# Patient Record
Sex: Female | Born: 1970 | Race: White | Hispanic: No | Marital: Married | Smoking: Never smoker
Health system: Southern US, Community
[De-identification: ages and names within clinical notes are randomized; demographics above are authoritative.]

## PROBLEM LIST (undated history)

## (undated) DIAGNOSIS — T8859XA Other complications of anesthesia, initial encounter: Secondary | ICD-10-CM

## (undated) DIAGNOSIS — G43909 Migraine, unspecified, not intractable, without status migrainosus: Secondary | ICD-10-CM

## (undated) DIAGNOSIS — F909 Attention-deficit hyperactivity disorder, unspecified type: Secondary | ICD-10-CM

## (undated) HISTORY — DX: Attention-deficit hyperactivity disorder, unspecified type: F90.9

## (undated) HISTORY — PX: APPENDECTOMY: SHX54

## (undated) HISTORY — DX: Migraine, unspecified, not intractable, without status migrainosus: G43.909

## (undated) HISTORY — PX: LIPOMA RESECTION: SHX23

---

## 2010-04-14 ENCOUNTER — Ambulatory Visit: Payer: Self-pay | Admitting: Family Medicine

## 2010-05-01 LAB — HM PAP SMEAR: HM Pap smear: NORMAL

## 2010-05-13 ENCOUNTER — Emergency Department: Payer: Self-pay | Admitting: Internal Medicine

## 2010-10-25 DIAGNOSIS — F909 Attention-deficit hyperactivity disorder, unspecified type: Secondary | ICD-10-CM

## 2010-10-25 HISTORY — DX: Attention-deficit hyperactivity disorder, unspecified type: F90.9

## 2010-10-25 HISTORY — PX: ABDOMINAL HYSTERECTOMY: SHX81

## 2011-05-04 IMAGING — US ULTRASOUND RIGHT BREAST
1 series · 9 of 9 positions shown · non-contrast
Comparison: None.

REASON FOR EXAM: RT BR PAIN UIQ
COMMENTS:

PROCEDURE:     US  - US BREAST RIGHT  - April 14, 2010  [DATE]
RESULT:

[Series 1: ultrasound right breast · 9 of 9 slices shown]
[im 1/9]
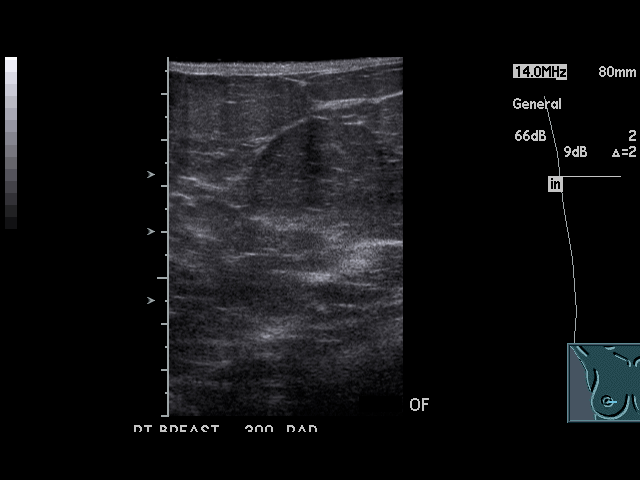
[im 2/9]
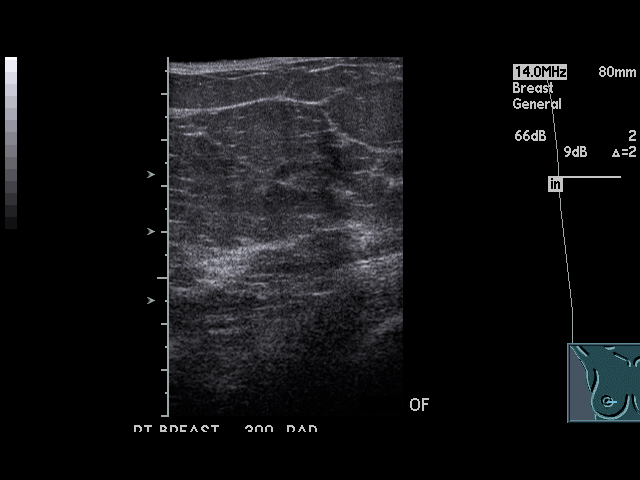
[im 3/9]
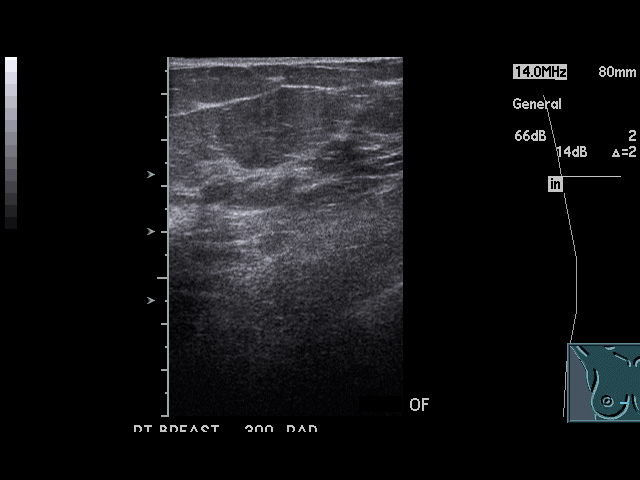
[im 4/9]
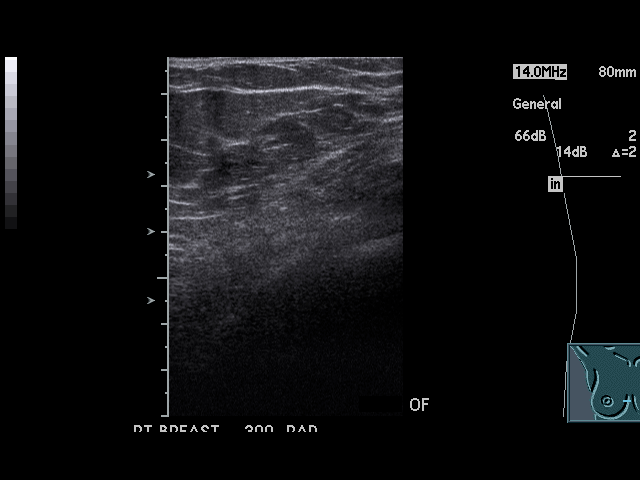
[im 5/9]
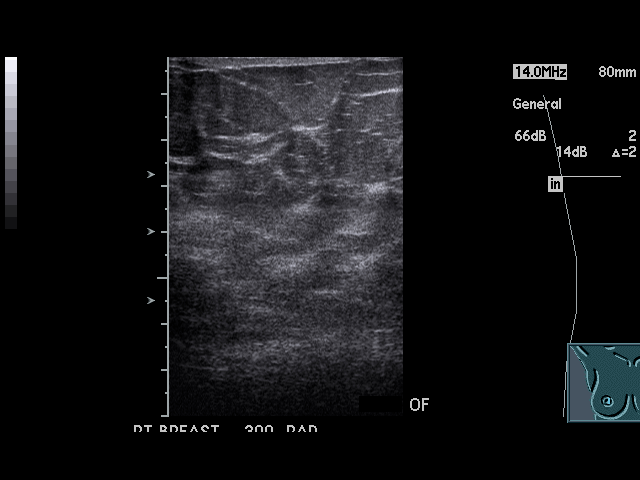
[im 6/9]
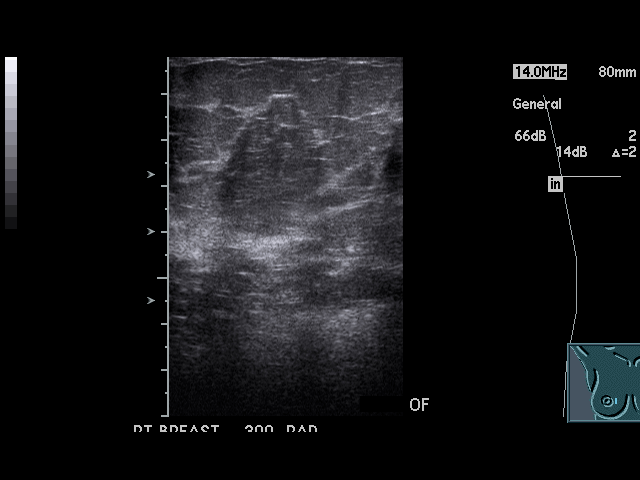
[im 7/9]
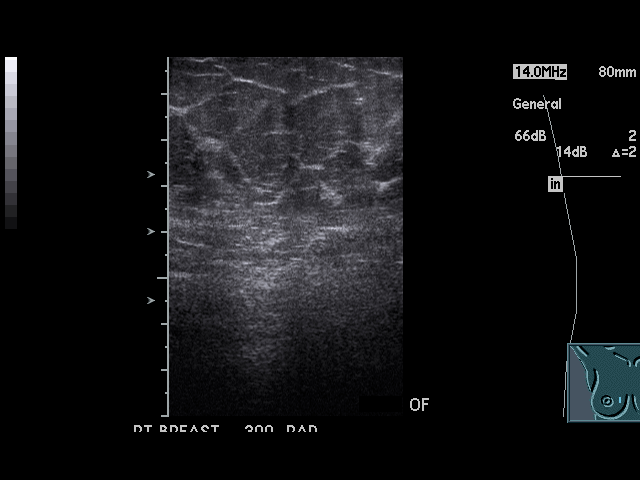
[im 8/9]
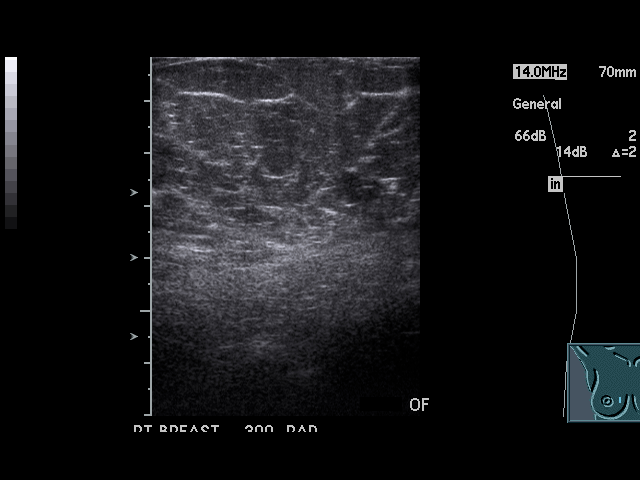
[im 9/9]
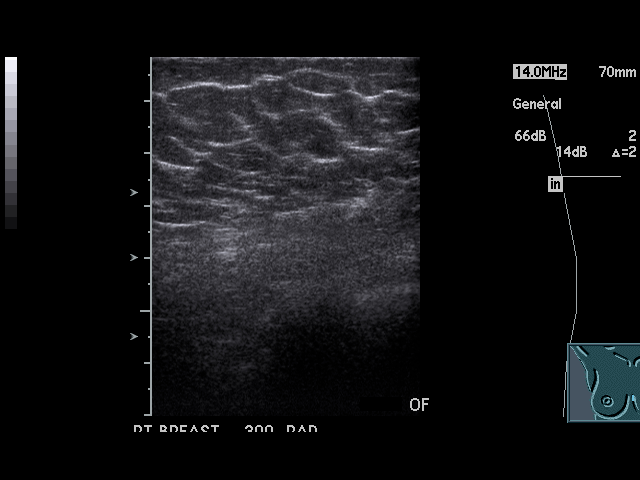

[9 of 9 positions shown; findings below may reference images not displayed]

FINDINGS: Bilateral breasts demonstrate a heterogeneous breast density.
There is no new dominant mass, architectural distortion or clusters of
suspicious appearing microcalcifications.

Further evaluation was performed with real time sonography of the right
breast at the 3 o'clock position.  There is no solid or cystic mass.  There
is no architectural distortion.
IMPRESSION: 1.Negative bilateral mammogram.

2.Negative focused right breast ultrasound at the 3 o'clock position.

BI-RADS: Category 2 - Benign Findings

## 2011-09-16 ENCOUNTER — Encounter: Payer: Self-pay | Admitting: Emergency Medicine

## 2011-09-16 ENCOUNTER — Emergency Department (HOSPITAL_COMMUNITY)
Admission: EM | Admit: 2011-09-16 | Discharge: 2011-09-16 | Disposition: A | Discharge status: Home / Self Care | Payer: BC Managed Care – PPO | Attending: Emergency Medicine | Admitting: Emergency Medicine

## 2011-09-16 DIAGNOSIS — K625 Hemorrhage of anus and rectum: Secondary | ICD-10-CM | POA: Insufficient documentation

## 2011-09-16 DIAGNOSIS — K6289 Other specified diseases of anus and rectum: Secondary | ICD-10-CM | POA: Insufficient documentation

## 2011-09-16 DIAGNOSIS — K648 Other hemorrhoids: Secondary | ICD-10-CM | POA: Insufficient documentation

## 2011-09-16 DIAGNOSIS — K921 Melena: Secondary | ICD-10-CM | POA: Insufficient documentation

## 2011-09-16 LAB — CBC
HCT: 42.8 % (ref 36.0–46.0)
MCH: 30.5 pg (ref 26.0–34.0)
MCHC: 33.9 g/dL (ref 30.0–36.0)
RDW: 12.8 % (ref 11.5–15.5)

## 2011-09-16 LAB — COMPREHENSIVE METABOLIC PANEL
AST: 23 U/L (ref 0–37)
Albumin: 4.2 g/dL (ref 3.5–5.2)
BUN: 14 mg/dL (ref 6–23)
Calcium: 10.1 mg/dL (ref 8.4–10.5)
Chloride: 100 mEq/L (ref 96–112)
Creatinine, Ser: 0.71 mg/dL (ref 0.50–1.10)
Total Protein: 7.9 g/dL (ref 6.0–8.3)

## 2011-09-16 LAB — DIFFERENTIAL
Basophils Absolute: 0 10*3/uL (ref 0.0–0.1)
Basophils Relative: 0 % (ref 0–1)
Eosinophils Absolute: 0.1 10*3/uL (ref 0.0–0.7)
Monocytes Absolute: 0.9 10*3/uL (ref 0.1–1.0)
Neutro Abs: 8.4 10*3/uL — ABNORMAL HIGH (ref 1.7–7.7)

## 2011-09-16 MED ORDER — HYDROCODONE-ACETAMINOPHEN 5-325 MG PO TABS
1.0000 | ORAL_TABLET | Freq: Once | ORAL | Status: AC
  Administered 2011-09-16: 1 via ORAL
  Filled 2011-09-16: qty 1

## 2011-09-16 MED ORDER — DOCUSATE SODIUM 100 MG PO CAPS: 100.0000 mg | ORAL_CAPSULE | Freq: Two times a day (BID) | ORAL | Status: AC

## 2011-09-16 MED ORDER — HYDROCODONE-ACETAMINOPHEN 5-500 MG PO TABS: 1.0000 | ORAL_TABLET | Freq: Four times a day (QID) | ORAL | Status: AC | PRN

## 2011-09-16 NOTE — ED Notes (Signed)
PT. REPORTS RECTAL BLEEDING X 10 DAYS , PAIN WHEN SITTING , RECENT TRAVEL TO [place] . "FEELS BLOATED".

## 2011-09-16 NOTE — ED Provider Notes (Signed)
History     CSN: [id number] Arrival date & time: 09/16/2011  7:16 PM   First MD Initiated Contact with Patient 09/16/11 2002      Chief Complaint  Patient presents with  . Rectal Bleeding    (Consider location/radiation/quality/duration/timing/severity/associated sxs/prior treatment) HPI Comments: PAteint has had rectal pressure a for the past 2 weeks was given RX fro Anusol suppositories and cream without relief  Has been using peri colace to soften the stools   Patient is a 40 y.o. female presenting with hematochezia. The history is provided by the patient.  Rectal Bleeding  The current episode started more than 2 weeks ago. The problem occurs continuously. The problem has been unchanged. The pain is severe. The stool is described as streaked with blood. Prior unsuccessful therapies include stool softeners. Associated symptoms include rectal pain. Pertinent negatives include no diarrhea. Urine output has been normal. There were no sick contacts.    History reviewed. No pertinent past medical history.  Past Surgical History  Procedure Date  . Abdominal hysterectomy   . Cesarean section     No family history on file.  History  Substance Use Topics  . Smoking status: Never Smoker   . Smokeless tobacco: Not on file  . Alcohol Use: Yes     OCCASIONAL    OB History    Grav Para Term Preterm Abortions TAB SAB Ect Mult Living                  Review of Systems  Constitutional: Negative.   HENT: Negative.   Eyes: Negative.   Respiratory: Negative.   Cardiovascular: Negative.   Gastrointestinal: Positive for blood in stool, hematochezia and rectal pain. Negative for diarrhea and constipation.  Genitourinary: Negative.   Musculoskeletal: Negative.   Skin: Negative.   Neurological: Negative.   Hematological: Negative.   Psychiatric/Behavioral: Negative.     Allergies  Review of patient's allergies indicates no known allergies.  Home Medications   Current  Outpatient Rx  Name Route Sig Dispense Refill  . AMPHETAMINE-DEXTROAMPHETAMINE 15 MG PO TABS Oral Take 15 mg by mouth daily.      Marland Kitchen HYDROCORTISONE 2.5 % RE CREA Rectal Place 1 application rectally 3 (three) times daily.      Marland Kitchen HYDROCORTISONE ACETATE 25 MG RE SUPP Rectal Place 25 mg rectally 2 (two) times daily.      Marland Kitchen DOCUSATE SODIUM 100 MG PO CAPS Oral Take 1 capsule (100 mg total) by mouth every 12 (twelve) hours. 60 capsule 0  . HYDROCODONE-ACETAMINOPHEN 5-500 MG PO TABS Oral Take 1-2 tablets by mouth every 6 (six) hours as needed for pain. 15 tablet 0    BP 138/82  Pulse 85  Temp(Src) 98.1 F (36.7 C) (Oral)  Resp 19  SpO2 98%  Physical Exam  Nursing note and vitals reviewed. Constitutional: She appears well-developed and well-nourished.  HENT:  Head: Normocephalic.  Eyes: EOM are normal.  Neck: Neck supple.  Cardiovascular: Regular rhythm.   Pulmonary/Chest: Breath sounds normal.  Abdominal: Bowel sounds are normal.  Genitourinary: Rectal exam shows internal hemorrhoid. Rectal exam shows no external hemorrhoid and no fissure.  Musculoskeletal: Normal range of motion.  Neurological: She is alert.  Skin: Skin is warm and dry.  Psychiatric: She has a normal mood and affect.    ED Course  Procedures (including critical care time)  Labs Reviewed  CBC - Abnormal; Notable for the following:    WBC 12.3 (*)    All other components within  normal limits  DIFFERENTIAL - Abnormal; Notable for the following:    Neutro Abs 8.4 (*)    All other components within normal limits  COMPREHENSIVE METABOLIC PANEL - Abnormal; Notable for the following:    Glucose, Bld 116 (*)    Total Bilirubin 0.2 (*)    All other components within normal limits   No results found.   1. Hemorrhoids, internal     9:07 PM Switched to colace BID Vicodin for pain and Surgical referrla  MDM  hemorrhoids vs perirectal abscess         Arman Filter, NP 09/16/11 2103  Arman Filter,  NP 09/16/11 2108

## 2011-09-17 NOTE — ED Provider Notes (Signed)
Medical screening examination/treatment/procedure(s) were performed by non-physician practitioner and as supervising physician I was immediately available for consultation/collaboration.   Geoffery Lyons, MD 09/17/11 [phone]

## 2011-10-06 ENCOUNTER — Ambulatory Visit (INDEPENDENT_AMBULATORY_CARE_PROVIDER_SITE_OTHER): Payer: BC Managed Care – PPO | Admitting: Surgery

## 2012-01-04 ENCOUNTER — Ambulatory Visit: Payer: BC Managed Care – PPO | Admitting: Internal Medicine

## 2012-01-14 ENCOUNTER — Ambulatory Visit (INDEPENDENT_AMBULATORY_CARE_PROVIDER_SITE_OTHER): Payer: BC Managed Care – PPO | Admitting: Internal Medicine

## 2012-01-14 ENCOUNTER — Encounter: Payer: Self-pay | Admitting: Internal Medicine

## 2012-01-14 VITALS — BP 102/65 | HR 90 | Temp 98.1°F | Resp 16 | Ht 65.0 in | Wt 220.0 lb

## 2012-01-14 DIAGNOSIS — K602 Anal fissure, unspecified: Secondary | ICD-10-CM | POA: Insufficient documentation

## 2012-01-14 DIAGNOSIS — Z0000 Encounter for general adult medical examination without abnormal findings: Secondary | ICD-10-CM

## 2012-01-14 DIAGNOSIS — F3289 Other specified depressive episodes: Secondary | ICD-10-CM

## 2012-01-14 DIAGNOSIS — F32A Depression, unspecified: Secondary | ICD-10-CM

## 2012-01-14 DIAGNOSIS — F339 Major depressive disorder, recurrent, unspecified: Secondary | ICD-10-CM | POA: Insufficient documentation

## 2012-01-14 DIAGNOSIS — F909 Attention-deficit hyperactivity disorder, unspecified type: Secondary | ICD-10-CM | POA: Insufficient documentation

## 2012-01-14 DIAGNOSIS — E669 Obesity, unspecified: Secondary | ICD-10-CM

## 2012-01-14 DIAGNOSIS — G43909 Migraine, unspecified, not intractable, without status migrainosus: Secondary | ICD-10-CM | POA: Insufficient documentation

## 2012-01-14 DIAGNOSIS — F329 Major depressive disorder, single episode, unspecified: Secondary | ICD-10-CM

## 2012-01-14 MED ORDER — LIDOCAINE 5 % EX OINT: TOPICAL_OINTMENT | CUTANEOUS | Status: DC | PRN

## 2012-01-14 NOTE — Patient Instructions (Signed)
I am prescribing lidocaine gel for use as needed on your fissure.  I do recommend daily use of miralax or other bulk forming laxative to help keep your stools from irritating your fissure.

## 2012-01-14 NOTE — Assessment & Plan Note (Addendum)
Mild, managed with wellbutrin 100 mg tid prescribed, but currently taking only 100 mg daily.  Can incresae to  Bid with 2nd dose by 1 pm to avoid insomnia

## 2012-01-14 NOTE — Progress Notes (Signed)
Patient ID: Candice Vaughn, female   DOB: [birth year], 41 y.o.   MRN: [id number]   Patient Active Problem List  Diagnoses  . Migraine syndrome  . 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 Depression  . Obesity (BMI 30-39.9)  . Annual physical exam  . Anal fissure    Subjective:  CC:   Chief Complaint  Patient presents with  . New Patient    HPI:   Candice Vaughn a 41 y.o. female with no significant past medical history who is here to establish primary care in transfer from Dr. Carlynn Purl of [facility].  Her chief complaint today is mild persistent rectal pain secondary to known anal fissure.  Her first occurrence of pain was last year after running a 5 K .  She had an evaluation by a gastroenteroloigst in [place] who offered surgery vs conventional measures.  Since she was not having bleeding she chose conventional measures.  the same time she had been started on Adderall for persistent symptoms of ADD, which has worked well for her and caused no concurrent problems.    Past Medical History  Diagnosis Date  . Migraine syndrome     once a month  . Attention deficit disorder of adult with hyperactivity 2012    diagnosed and treated  Oct 2012    Past Surgical History  Procedure Date  . Cesarean section   . Abdominal hysterectomy jan 2012    endometriosis, inflammation, failed ablation  . Lipoma resection     left axilla, [facility]         The following portions of the patient's history were reviewed and updated as appropriate: Allergies, current medications, and problem list.    Review of Systems:   12 Pt  review of systems was negative except those addressed in the HPI,     History   Social History  . Marital Status: Married    Spouse Name: N/A    Number of Children: N/A  . Years of Education: N/A   Occupational History  . Not on file.   Social History Main Topics  . Smoking status: Never Smoker   . Smokeless tobacco: Never Used    . Alcohol Use: Yes     OCCASIONAL  . Drug Use: No  . Sexually Active: Not on file   Other Topics Concern  . Not on file   Social History Narrative  . No narrative on file    Objective:  BP 102/65  Pulse 90  Temp(Src) 98.1 F (36.7 C) (Oral)  Resp 16  Ht 5\' 5"  (1.651 m)  Wt 220 lb (99.791 kg)  BMI 36.61 kg/m2  SpO2 100%  General appearance: alert, cooperative and appears stated age Ears: normal TM's and external ear canals both ears Throat: lips, mucosa, and tongue normal; teeth and gums normal Neck: no adenopathy, no carotid bruit, supple, symmetrical, trachea midline and thyroid not enlarged, symmetric, no tenderness/mass/nodules Back: symmetric, no curvature. ROM normal. No CVA tenderness. Lungs: clear to auscultation bilaterally Heart: regular rate and rhythm, S1, S2 normal, no murmur, click, rub or gallop Abdomen: soft, non-tender; bowel sounds normal; no masses,  no organomegaly Pulses: 2+ and symmetric Skin: Skin color, texture, turgor normal. No rashes or lesions Lymph nodes: Cervical, supraclavicular, and axillary nodes normal.  Assessment and Plan:  Depression Mild, managed with wellbutrin 100 mg tid prescribed, but currently taking only 100 mg daily.  Can incresae to  Bid with 2nd dose by 1 pm to avoid insomnia  Obesity (BMI 30-39.9) Addressed today. Patient has been working diligently to lower her weight. Her highest was 240 and she is currently 215 based on her weight at home on her scale. She has been working with a Systems analyst twice a week and using a treadmill for daily exercise. We discussed the low glycemic index diet and a handout has been given.  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 Lidocaine gel has been prescribed for topical use per patient request. I also recommend soaking in Epsom salts.  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  Annual physical exam She has no history of hypertension or hyperlipidemia or diabetes and is up-to-date on her GYN exams through [facility].     Updated Medication List Outpatient Encounter Prescriptions as of 01/14/2012  Medication Sig Dispense Refill  . ALPRAZolam (XANAX) 0.5 MG tablet Take 0.5 mg by mouth at bedtime as needed.      Marland Kitchen amphetamine-dextroamphetamine (ADDERALL) 30 MG tablet Take 30 mg by mouth daily.      Marland Kitchen buPROPion (WELLBUTRIN) 100 MG tablet Take 100 mg by mouth 3 (three) times daily.      Marland Kitchen HYDROcodone-acetaminophen (NORCO) 5-325 MG per tablet Take 1 tablet by mouth every 6 (six) hours as needed.      . metroNIDAZOLE (METROGEL) 1 % gel Apply 1 application topically daily.      . rizatriptan (MAXALT) 10 MG tablet Take 10 mg by mouth as needed. May repeat in 2 hours if needed      . lidocaine (XYLOCAINE) 5 % ointment Apply topically as needed.  35.44 g  1  . DISCONTD: amphetamine-dextroamphetamine (ADDERALL) 15 MG tablet Take 15 mg by mouth daily.        Marland Kitchen DISCONTD: hydrocortisone (ANUSOL-HC) 2.5 % rectal cream Place 1 application rectally 3 (three) times daily.        Marland Kitchen DISCONTD: hydrocortisone (ANUSOL-HC) 25 MG suppository Place 25 mg rectally 2 (two) times daily.           No orders of the defined types were placed in this encounter.    No Follow-up on file.

## 2012-01-16 ENCOUNTER — Encounter: Payer: Self-pay | Admitting: Internal Medicine

## 2012-01-16 NOTE — Assessment & Plan Note (Signed)
She has no history of hypertension or hyperlipidemia or diabetes and is up-to-date on her GYN exams through [facility].

## 2012-01-16 NOTE — Assessment & Plan Note (Addendum)
Chronic with periodic episodes of pain aggravated by diarrhea and constipation. Lidocaine gel has been prescribed for topical use per patient request. I also recommend soaking in Epsom salts.

## 2012-01-16 NOTE — Assessment & Plan Note (Signed)
Addressed today. Patient has been working diligently to lower her weight. Her highest was 240 and she is currently 215 based on her weight at home on her scale. She has been working with a Systems analyst twice a week and using a treadmill for daily exercise. We discussed the low glycemic index diet and a handout has been given.

## 2012-01-16 NOTE — Assessment & Plan Note (Addendum)
Benish with Adderall 30 mg daily ; dose has been stable.

## 2012-02-21 ENCOUNTER — Other Ambulatory Visit: Payer: Self-pay | Admitting: Internal Medicine

## 2012-02-22 ENCOUNTER — Other Ambulatory Visit: Payer: Self-pay | Admitting: Internal Medicine

## 2012-02-22 DIAGNOSIS — K602 Anal fissure, unspecified: Secondary | ICD-10-CM

## 2012-02-22 MED ORDER — METRONIDAZOLE 1 % EX GEL: 1.0000 "application " | Freq: Every day | CUTANEOUS | Status: DC

## 2012-02-22 MED ORDER — AMPHETAMINE-DEXTROAMPHETAMINE 30 MG PO TABS: 30.0000 mg | ORAL_TABLET | Freq: Every day | ORAL | Status: DC

## 2012-02-22 MED ORDER — HYDROCODONE-ACETAMINOPHEN 5-325 MG PO TABS: 1.0000 | ORAL_TABLET | Freq: Four times a day (QID) | ORAL | Status: DC | PRN

## 2012-02-22 MED ORDER — LIDOCAINE 5 % EX OINT: TOPICAL_OINTMENT | CUTANEOUS | Status: AC | PRN

## 2012-02-22 MED ORDER — RIZATRIPTAN BENZOATE 10 MG PO TABS: 10.0000 mg | ORAL_TABLET | ORAL | Status: DC | PRN

## 2012-02-22 MED ORDER — ALPRAZOLAM 0.5 MG PO TABS: 0.5000 mg | ORAL_TABLET | Freq: Every evening | ORAL | Status: DC | PRN

## 2012-02-22 NOTE — Telephone Encounter (Signed)
Patient requested all of her Rxs be printed so she can take them to the pharmacy.

## 2012-03-27 ENCOUNTER — Other Ambulatory Visit: Payer: Self-pay | Admitting: Internal Medicine

## 2012-03-27 MED ORDER — AMPHETAMINE-DEXTROAMPHETAMINE 30 MG PO TABS: 30.0000 mg | ORAL_TABLET | Freq: Every day | ORAL | Status: DC

## 2012-05-19 ENCOUNTER — Other Ambulatory Visit: Payer: Self-pay | Admitting: Internal Medicine

## 2012-05-19 MED ORDER — AMPHETAMINE-DEXTROAMPHETAMINE 30 MG PO TABS: 30.0000 mg | ORAL_TABLET | Freq: Every day | ORAL | Status: DC

## 2012-06-15 ENCOUNTER — Encounter: Payer: Self-pay | Admitting: Internal Medicine

## 2012-06-19 ENCOUNTER — Telehealth: Payer: Self-pay | Admitting: Internal Medicine

## 2012-06-19 MED ORDER — AMPHETAMINE-DEXTROAMPHETAMINE 30 MG PO TABS: 30.0000 mg | ORAL_TABLET | Freq: Every day | ORAL | Status: DC

## 2012-06-19 NOTE — Telephone Encounter (Signed)
Pt called to get refill on her addrell

## 2012-07-19 ENCOUNTER — Telehealth: Payer: Self-pay | Admitting: Internal Medicine

## 2012-07-19 NOTE — Telephone Encounter (Signed)
Refill on Adderall 30 mg.

## 2012-07-20 MED ORDER — AMPHETAMINE-DEXTROAMPHETAMINE 30 MG PO TABS: 30.0000 mg | ORAL_TABLET | Freq: Every day | ORAL | Status: DC

## 2012-08-18 ENCOUNTER — Other Ambulatory Visit: Payer: Self-pay

## 2012-08-18 ENCOUNTER — Other Ambulatory Visit: Payer: Self-pay | Admitting: Internal Medicine

## 2012-08-30 ENCOUNTER — Telehealth: Payer: Self-pay | Admitting: Internal Medicine

## 2012-08-30 NOTE — Telephone Encounter (Signed)
Pt is needing refill on her Adderall and Hydrocodone.

## 2012-08-31 ENCOUNTER — Other Ambulatory Visit: Payer: Self-pay

## 2012-08-31 NOTE — Telephone Encounter (Signed)
Refill request for Adderall 30 mg and Hydrocodone -acetaminophen 5-325 mg. Ok to refill?

## 2012-09-01 MED ORDER — HYDROCODONE-ACETAMINOPHEN 5-325 MG PO TABS: 1.0000 | ORAL_TABLET | Freq: Four times a day (QID) | ORAL | Status: DC | PRN

## 2012-09-01 MED ORDER — AMPHETAMINE-DEXTROAMPHETAMINE 30 MG PO TABS: 30.0000 mg | ORAL_TABLET | Freq: Every day | ORAL | Status: DC

## 2012-09-01 NOTE — Telephone Encounter (Signed)
Rx for Hydrocodone called to [facility] pharmacy, Adderall Rx is ready for pick up will be left at front desk, patient advised via telephone.

## 2012-09-01 NOTE — Telephone Encounter (Signed)
Ok to refill,  Authorized in epic 

## 2012-09-28 ENCOUNTER — Other Ambulatory Visit: Payer: Self-pay

## 2012-09-28 MED ORDER — AMPHETAMINE-DEXTROAMPHETAMINE 30 MG PO TABS: 30.0000 mg | ORAL_TABLET | Freq: Every day | ORAL | Status: DC

## 2012-09-29 ENCOUNTER — Other Ambulatory Visit: Payer: Self-pay | Admitting: Internal Medicine

## 2012-09-29 MED ORDER — BUPROPION HCL ER (XL) 300 MG PO TB24: 300.0000 mg | ORAL_TABLET | Freq: Every day | ORAL | Status: DC

## 2012-12-18 ENCOUNTER — Other Ambulatory Visit: Payer: Self-pay | Admitting: *Deleted

## 2012-12-18 NOTE — Telephone Encounter (Signed)
Patient called requesting refills on her medications. Patient needs written script for her Adderall and would like to pick up 3 months of this if you can do it that way. Patient states that she will pickup the other scripts when she comes to pickup her Adderall.

## 2012-12-19 NOTE — Telephone Encounter (Signed)
Please print out for signature as reqeusted  3 months on  The wellbutrin but the adderall vicodin and alprazolam can only be filled for 30 days  since she has not been seen as a patient since March 2013.

## 2012-12-20 NOTE — Telephone Encounter (Signed)
Patient came by the office to pickup prescriptions.

## 2012-12-22 ENCOUNTER — Other Ambulatory Visit: Payer: Self-pay | Admitting: Internal Medicine

## 2012-12-22 MED ORDER — ALPRAZOLAM 0.5 MG PO TABS: 0.5000 mg | ORAL_TABLET | Freq: Every evening | ORAL | Status: DC | PRN

## 2012-12-22 MED ORDER — HYDROCODONE-ACETAMINOPHEN 5-325 MG PO TABS: 1.0000 | ORAL_TABLET | Freq: Four times a day (QID) | ORAL | Status: DC | PRN

## 2012-12-22 MED ORDER — BUPROPION HCL ER (XL) 300 MG PO TB24: 300.0000 mg | ORAL_TABLET | Freq: Every day | ORAL | Status: DC

## 2012-12-22 MED ORDER — AMPHETAMINE-DEXTROAMPHETAMINE 30 MG PO TABS: 30.0000 mg | ORAL_TABLET | Freq: Every day | ORAL | Status: DC

## 2012-12-22 NOTE — Telephone Encounter (Signed)
The patient was informed that her scripts were faxed to [facility] in [place].

## 2012-12-22 NOTE — Telephone Encounter (Signed)
Patient called to see if her prescription for ADDERALL) 15 MG was ready.

## 2012-12-22 NOTE — Telephone Encounter (Signed)
I do not understand why these medications weren't printed out on 2/25 per my note to [place] .  Patient has now been by twice to pick these up .  Please fax them to her pharmacy and tell her they were taken care of for 30 days per my message on 2./25/  The adderall cannot be filled until march 5

## 2012-12-25 ENCOUNTER — Other Ambulatory Visit: Payer: Self-pay | Admitting: *Deleted

## 2012-12-26 ENCOUNTER — Other Ambulatory Visit: Payer: Self-pay | Admitting: *Deleted

## 2012-12-26 NOTE — Telephone Encounter (Signed)
Patient's scripts faxed

## 2012-12-27 ENCOUNTER — Other Ambulatory Visit: Payer: Self-pay | Admitting: *Deleted

## 2012-12-27 NOTE — Telephone Encounter (Signed)
Please advise. Pt last seen 01/14/12

## 2012-12-28 ENCOUNTER — Other Ambulatory Visit: Payer: Self-pay | Admitting: *Deleted

## 2012-12-29 NOTE — Telephone Encounter (Signed)
There is no refill attached to your message.

## 2013-01-01 ENCOUNTER — Other Ambulatory Visit: Payer: Self-pay | Admitting: Internal Medicine

## 2013-01-01 MED ORDER — AMPHETAMINE-DEXTROAMPHETAMINE 30 MG PO TABS: 30.0000 mg | ORAL_TABLET | Freq: Every day | ORAL | Status: DC

## 2013-01-01 NOTE — Telephone Encounter (Signed)
Rec/eived refill request electronically. Last office visit. 01/14/12. Is it okay to refill medicaiton

## 2013-01-05 MED ORDER — RIZATRIPTAN BENZOATE 10 MG PO TABS: 10.0000 mg | ORAL_TABLET | ORAL | Status: DC | PRN

## 2013-01-05 NOTE — Telephone Encounter (Signed)
Med filled.  

## 2013-01-08 ENCOUNTER — Ambulatory Visit (INDEPENDENT_AMBULATORY_CARE_PROVIDER_SITE_OTHER): Payer: BC Managed Care – PPO | Admitting: Internal Medicine

## 2013-01-08 ENCOUNTER — Encounter: Payer: Self-pay | Admitting: Internal Medicine

## 2013-01-08 VITALS — BP 126/80 | HR 101 | Temp 98.2°F | Resp 16 | Wt 216.0 lb

## 2013-01-08 DIAGNOSIS — E282 Polycystic ovarian syndrome: Secondary | ICD-10-CM

## 2013-01-08 DIAGNOSIS — E669 Obesity, unspecified: Secondary | ICD-10-CM

## 2013-01-08 DIAGNOSIS — K602 Anal fissure, unspecified: Secondary | ICD-10-CM

## 2013-01-08 DIAGNOSIS — F909 Attention-deficit hyperactivity disorder, unspecified type: Secondary | ICD-10-CM

## 2013-01-08 MED ORDER — AMPHETAMINE-DEXTROAMPHETAMINE 30 MG PO TABS: 30.0000 mg | ORAL_TABLET | Freq: Every day | ORAL | Status: DC

## 2013-01-08 MED ORDER — METFORMIN HCL 500 MG PO TABS: 500.0000 mg | ORAL_TABLET | Freq: Two times a day (BID) | ORAL | Status: DC

## 2013-01-08 NOTE — Assessment & Plan Note (Signed)
She is requesting a second opinion on treatment of the anal fissure since it is causing persistent pain and bleeding.

## 2013-01-08 NOTE — Progress Notes (Signed)
Patient ID: Candice Vaughn, female   DOB: 11/29/1970, 42 y.o.   MRN: [id number]  Patient Active Problem List  Diagnosis  . Migraine syndrome  . 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 Depression  . Obesity (BMI 30-39.9)  . Annual physical exam  . Anal fissure    Subjective:  CC:   Chief Complaint  Patient presents with  . Follow-up    Medication Refill    HPI:   Candice Vaughn a 42 y.o. female who presents 8 month follow up on chronic medical issues. She continues to have episodes of rectal bleeding and rectal pain secondary to known anal fissure.  Prior evaluation by a gastroenterologist in [place] who offered surgery vs conventional measures.  Since she was not having bleeding she chose conventional measures.She would like to have a second evaluation .  She continues to use  Adderall for persistent symptoms of ADD, which has worked well for her and caused no concurrent problems.   She is requesting use of metformin for management of obesity and PCOS because her sister has been able to lose weight using it and her health has improved.      Past Medical History  Diagnosis Date  . Migraine syndrome     once a month  . 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 Cesarean section    . Abdominal hysterectomy  jan 2012    endometriosis, inflammation, failed ablation  . Lipoma resection      left axilla, [facility]       The following portions of the patient's history were reviewed and updated as appropriate: Allergies, current medications, and problem list.    Review of Systems:   Patient denies headache, fevers, malaise, unintentional weight loss, skin rash, eye pain, sinus congestion and sinus pain, sore throat, dysphagia,  hemoptysis , cough, dyspnea, wheezing, chest pain, palpitations, orthopnea, edema, abdominal pain, nausea, melena,  diarrhea, constipation, flank pain, dysuria, hematuria, urinary  Frequency, nocturia, numbness, tingling, seizures,  Focal weakness, Loss of consciousness,  Tremor, insomnia, depression, anxiety, and suicidal ideation.     History   Social History  . Marital Status: Married    Spouse Name: N/A    Number of Children: N/A  . Years of Education: N/A   Occupational History  . Not on file.   Social History Main Topics  . Smoking status: Never Smoker   . Smokeless tobacco: Never Used  . Alcohol Use: Yes     Comment: OCCASIONAL  . Drug Use: No  . Sexually Active: Not on file   Other Topics Concern  . Not on file   Social History Narrative  . No narrative on file    Objective:  BP 126/80  Pulse 101  Temp(Src) 98.2 F (36.8 C) (Oral)  Resp 16  Wt 216 lb (97.977 kg)  BMI 35.94 kg/m2  SpO2 96%  General appearance: alert, cooperative and appears stated age Ears: normal TM's and external ear canals both ears Throat: lips, mucosa, and tongue normal; teeth and gums normal Neck: no adenopathy, no carotid bruit, supple, symmetrical, trachea midline and thyroid not enlarged, symmetric, no tenderness/mass/nodules Back: symmetric, no curvature. ROM normal. No CVA tenderness. Lungs: clear to auscultation bilaterally Heart: regular rate and rhythm, S1, S2 normal, no murmur, click, rub or gallop Abdomen: soft, non-tender; bowel sounds normal; no masses,  no organomegaly Pulses:  2+ and symmetric Skin: Skin color, texture, turgor normal. No rashes or lesions Lymph nodes: Cervical, supraclavicular, and axillary nodes normal.  Assessment and Plan:  Obesity (BMI 30-39.9) Discussed trial of metformin since sister has been diagnosed with PCOS   Anal fissure She is requesting a second opinion on treatment of the anal fissure since it is causing persistent pain and bleeding.   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  Refills given.    Updated  Medication List Outpatient Encounter Prescriptions as of 01/08/2013  Medication Sig Dispense Refill  . ALPRAZolam (XANAX) 0.5 MG tablet Take 1 tablet (0.5 mg total) by mouth at bedtime as needed.  30 tablet  0  . amphetamine-dextroamphetamine (ADDERALL) 30 MG tablet Take 1 tablet (30 mg total) by mouth daily.  30 tablet  0  . amphetamine-dextroamphetamine (ADDERALL) 30 MG tablet Take 1 tablet (30 mg total) by mouth daily.  30 tablet  0  . amphetamine-dextroamphetamine (ADDERALL) 30 MG tablet Take 1 tablet (30 mg total) by mouth daily.  30 tablet  0  . amphetamine-dextroamphetamine (ADDERALL) 30 MG tablet Take 1 tablet (30 mg total) by mouth daily.  30 tablet  0  . buPROPion (WELLBUTRIN XL) 300 MG 24 hr tablet Take 1 tablet (300 mg total) by mouth daily.  30 tablet  5  . HYDROcodone-acetaminophen (NORCO/VICODIN) 5-325 MG per tablet Take 1 tablet by mouth every 6 (six) hours as needed for pain.  30 tablet  0  . lidocaine (XYLOCAINE) 5 % ointment Apply topically as needed.  35.44 g  1  . metroNIDAZOLE (METROGEL) 1 % gel Apply 1 application topically daily.  45 g  1  . rizatriptan (MAXALT) 10 MG tablet Take 1 tablet (10 mg total) by mouth as needed. May repeat in 2 hours if needed  10 tablet  1  . [DISCONTINUED] amphetamine-dextroamphetamine (ADDERALL) 30 MG tablet Take 1 tablet (30 mg total) by mouth daily.  30 tablet  0  . [DISCONTINUED] amphetamine-dextroamphetamine (ADDERALL) 30 MG tablet Take 1 tablet (30 mg total) by mouth daily.  30 tablet  0  . [DISCONTINUED] amphetamine-dextroamphetamine (ADDERALL) 30 MG tablet Take 1 tablet (30 mg total) by mouth daily.  30 tablet  0  . [DISCONTINUED] amphetamine-dextroamphetamine (ADDERALL) 30 MG tablet Take 1 tablet (30 mg total) by mouth daily.  30 tablet  0  . metFORMIN (GLUCOPHAGE) 500 MG tablet Take 1 tablet (500 mg total) by mouth 2 (two) times daily with a meal.  180 tablet  3   No facility-administered encounter medications on file as of 01/08/2013.      No orders of the defined types were placed in this encounter.    No Follow-up on file.

## 2013-01-08 NOTE — Assessment & Plan Note (Addendum)
Managed with Adderall.  Refills given.

## 2013-01-08 NOTE — Assessment & Plan Note (Signed)
Discussed trial of metformin since sister has been diagnosed with PCOS

## 2013-01-09 ENCOUNTER — Telehealth: Payer: Self-pay | Admitting: Emergency Medicine

## 2013-01-09 ENCOUNTER — Ambulatory Visit: Payer: BC Managed Care – PPO | Admitting: Internal Medicine

## 2013-01-09 NOTE — Telephone Encounter (Signed)
Spoke with pt and made her aware of the apt @ [facility].

## 2013-01-21 NOTE — Telephone Encounter (Signed)
Please find out if Ms. Candice Vaughn is waiting on any more refills specifically for the alprazolam.

## 2013-01-23 NOTE — Telephone Encounter (Signed)
Pt stated she finally received all of her medications.

## 2013-01-29 ENCOUNTER — Ambulatory Visit: Payer: Self-pay | Admitting: General Surgery

## 2013-04-01 LAB — HM MAMMOGRAPHY: HM Mammogram: NORMAL

## 2013-06-02 ENCOUNTER — Other Ambulatory Visit: Payer: Self-pay | Admitting: Internal Medicine

## 2013-06-04 NOTE — Telephone Encounter (Signed)
Last appt 01/08/13. Has appt scheduled 9/14

## 2013-06-05 NOTE — Telephone Encounter (Signed)
Rx faxed to pharmacy  

## 2013-06-07 ENCOUNTER — Telehealth: Payer: Self-pay | Admitting: *Deleted

## 2013-06-07 DIAGNOSIS — E282 Polycystic ovarian syndrome: Secondary | ICD-10-CM

## 2013-06-07 MED ORDER — RIZATRIPTAN BENZOATE 10 MG PO TABS: 10.0000 mg | ORAL_TABLET | ORAL | Status: DC | PRN

## 2013-06-07 MED ORDER — METRONIDAZOLE 1 % EX GEL: 1.0000 "application " | Freq: Every day | CUTANEOUS | Status: DC

## 2013-06-07 MED ORDER — ALPRAZOLAM 0.5 MG PO TABS: ORAL_TABLET | ORAL | Status: DC

## 2013-06-07 MED ORDER — METFORMIN HCL 500 MG PO TABS: 500.0000 mg | ORAL_TABLET | Freq: Two times a day (BID) | ORAL | Status: DC

## 2013-06-07 MED ORDER — BUPROPION HCL ER (XL) 300 MG PO TB24: 300.0000 mg | ORAL_TABLET | Freq: Every day | ORAL | Status: DC

## 2013-06-07 NOTE — Telephone Encounter (Signed)
Ok to refill,  Authorized in epic 

## 2013-06-07 NOTE — Telephone Encounter (Signed)
Patient need 90 day supply on all medications, except for pain medication, patient is changing pharmacy and they need thirty day supply. Patient has an appointment 07/02/13.

## 2013-06-08 NOTE — Telephone Encounter (Signed)
Scripts faxed to pharmacy

## 2013-06-12 ENCOUNTER — Encounter: Payer: Self-pay | Admitting: Internal Medicine

## 2013-06-26 ENCOUNTER — Other Ambulatory Visit: Payer: Self-pay | Admitting: *Deleted

## 2013-06-26 DIAGNOSIS — E282 Polycystic ovarian syndrome: Secondary | ICD-10-CM

## 2013-06-27 MED ORDER — METFORMIN HCL 500 MG PO TABS: 500.0000 mg | ORAL_TABLET | Freq: Two times a day (BID) | ORAL | Status: DC

## 2013-06-27 MED ORDER — BUPROPION HCL ER (XL) 300 MG PO TB24: 300.0000 mg | ORAL_TABLET | Freq: Every day | ORAL | Status: DC

## 2013-06-27 MED ORDER — ALPRAZOLAM 0.5 MG PO TABS: ORAL_TABLET | ORAL | Status: DC

## 2013-06-27 NOTE — Addendum Note (Signed)
Addended by: Sherlene Shams on: 06/27/2013 07:16 PM   Modules accepted: Orders

## 2013-06-27 NOTE — Telephone Encounter (Signed)
Ok to refill,  Authorized in epic 

## 2013-06-27 NOTE — Telephone Encounter (Signed)
Has appt scheduled 07/02/13

## 2013-06-29 ENCOUNTER — Telehealth: Payer: Self-pay | Admitting: *Deleted

## 2013-06-29 NOTE — Telephone Encounter (Signed)
error 

## 2013-07-02 ENCOUNTER — Encounter: Payer: Self-pay | Admitting: Internal Medicine

## 2013-07-02 ENCOUNTER — Ambulatory Visit (INDEPENDENT_AMBULATORY_CARE_PROVIDER_SITE_OTHER): Payer: BC Managed Care – PPO | Admitting: Internal Medicine

## 2013-07-02 VITALS — BP 110/72 | HR 89 | Temp 98.3°F | Resp 14 | Ht 60.0 in | Wt 216.0 lb

## 2013-07-02 DIAGNOSIS — E669 Obesity, unspecified: Secondary | ICD-10-CM

## 2013-07-02 DIAGNOSIS — K602 Anal fissure, unspecified: Secondary | ICD-10-CM

## 2013-07-02 DIAGNOSIS — L650 Telogen effluvium: Secondary | ICD-10-CM | POA: Insufficient documentation

## 2013-07-02 DIAGNOSIS — F909 Attention-deficit hyperactivity disorder, unspecified type: Secondary | ICD-10-CM

## 2013-07-02 MED ORDER — SPIRONOLACTONE 25 MG PO TABS: 25.0000 mg | ORAL_TABLET | Freq: Every day | ORAL | Status: DC

## 2013-07-02 MED ORDER — AMPHETAMINE-DEXTROAMPHETAMINE 30 MG PO TABS: 30.0000 mg | ORAL_TABLET | Freq: Every day | ORAL | Status: DC

## 2013-07-02 NOTE — Assessment & Plan Note (Signed)
Advised to avoid surgery at all costs by second surgery eval.  Advised to increase fiber in diet.

## 2013-07-02 NOTE — Assessment & Plan Note (Signed)
Managed with Adderall,  No changes to regimen, refills given

## 2013-07-02 NOTE — Patient Instructions (Addendum)
I WILL TEXT YOU OUR BROCCOLI/CAULIFLOWER  RECIPES    This is my version of a  "Low GI"  Diet:  It will still lower your blood sugars and allow you to lose 4 to 8  lbs  per month if you follow it carefully.  Your goal with exercise is a minimum of 30 minutes of aerobic exercise 5 days per week (Walking does not count once it becomes easy!)    All of the foods can be found at grocery stores and in bulk at [employer].  The Atkins protein bars and shakes are available in more varieties at Target, WalMart and [employer]'s Foods.     7 AM Breakfast:  Choose from the following:  Low carbohydrate Protein  Shakes (I recommend the EAS AdvantEdge "Carb Control" shakes  Or the low carb shakes by Atkins.    2.5 carbs   Arnold's "Sandwhich Thin"toasted  w/ peanut butter (no jelly: about 20 net carbs  "Bagel Thin" with cream cheese and salmon: about 20 carbs   a scrambled egg/bacon/cheese burrito made with Mission's "carb balance" whole wheat tortilla  (about 10 net carbs )   Avoid cereal and bananas, oatmeal and cream of wheat and grits. They are loaded with carbohydrates!   10 AM: high protein snack  Protein bar by Atkins (the snack size, under 200 cal, usually < 6 net carbs).    A stick of cheese:  Around 1 carb,  100 cal     Dannon Light n Fit [place] Yogurt  (80 cal, 8 carbs)  Other so called "protein bars" and Greek yogurts tend to be loaded with carbohydrates.  Remember, in food advertising, the word "energy" is synonymous for " carbohydrate."  Lunch:   A Sandwich using the bread choices listed, Can use any  Eggs,  lunchmeat, grilled meat or canned tuna), avocado, regular mayo/mustard  and cheese.  A Salad using blue cheese, ranch,  Goddess or vinagrette,  No croutons or "confetti" and no "candied nuts" but regular nuts OK.   No pretzels or chips.  Pickles and miniature sweet peppers are a good low carb alternative that provide a "crunch"  The bread is the only source of carbohydrate in a sandwich and  can  be decreased by trying some of these alternatives to traditional loaf bread  Joseph's makes a pita bread and a flat bread that are 50 cal and 4 net carbs available at BJs and WalMart.  This can be toasted to use with hummous as well  Toufayan makes a low carb flatbread that's 100 cal and 9 net carbs available at [employer] and [employer] makes 2 sizes of  Low carb whole wheat tortilla  (The large one is 210 cal and 6 net carbs) Avoid "Low fat dressings, as well as Reyne Dumas and [address] dressings They are loaded with sugar!   3 PM/ Mid day  Snack:  Consider  1 ounce of  almonds, walnuts, pistachios, pecans, peanuts,  Macadamia nuts or a nut medley.  Avoid "granola"; the dried cranberries and raisins are loaded with carbohydrates. Mixed nuts as long as there are no raisins,  cranberries or dried fruit.     6 PM  Dinner:     Meat/fowl/fish with a green salad, and either broccoli, cauliflower, green beans, spinach, brussel sprouts or  Lima beans. DO NOT BREAD THE PROTEIN!!      There is a low carb pasta by Dreamfield's that is acceptable and tastes great: only  5 digestible carbs/serving.( All grocery stores but BJs carry it )  Try Kai Levins Angelo's chicken piccata or chicken or eggplant parm over low carb pasta.(Lowes and BJs)   Clifton Custard Sanchez's "Carnitas" (pulled pork, no sauce,  0 carbs) or his beef pot roast to make a dinner burrito (at BJ's)  Pesto over low carb pasta (bj's sells a good quality pesto in the center refrigerated section of the deli   Whole wheat pasta is still full of digestible carbs and  Not as low in glycemic index as Dreamfield's.   Brown rice is still rice,  So skip the rice and noodles if you eat [place] or [place] (or at least limit to 1/2 cup)  9 PM snack :   Breyer's "low carb" fudgsicle or  ice cream bar (Carb Smart line), or  Weight Watcher's ice cream bar , or another "no sugar added" ice cream;  a serving of fresh berries/cherries with whipped cream   Cheese  or DANNON'S LlGHT N FIT [place] YOGURT  Avoid bananas, pineapple, grapes  and watermelon on a regular basis because they are high in sugar.  THINK OF THEM AS DESSERT  Remember that snack Substitutions should be less than 10 NET carbs per serving and meals < 20 carbs. Remember to subtract fiber grams to get the "net carbs."

## 2013-07-02 NOTE — Progress Notes (Signed)
Patient ID: Candice Vaughn, female   DOB: 1970/12/14, 42 y.o.   MRN: [id number]  Patient Active Problem List   Diagnosis Date Noted  . Depression 01/14/2012  . Obesity (BMI 30-39.9) 01/14/2012  . Annual physical exam 01/14/2012  . 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 Migraine syndrome   . 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 Follow-up    HPI:   Candice Vaughn a 42 y.o. female who presents  For follow up on anal fissure, obesity, GAD and  ADD.    Has been exercising and dieting since April with no weight loss.  Diet and exercise reviewed.  Consistency an issue and commitment to one form of diet an issue .  Still losing a fair amount of hair and wondering if this is due to thyroid.  Lots of emotional stressors due to husband's near death experience several months ago, as well as her work/study schedule.  Using adderall without problems. No new issues.     She continues to have episodes of rectal bleeding and rectal pain secondary to known anal fissure.  Prior evaluation by a gastroenterologist in [place] who offered surgery vs conventional measures.  Since she was not having bleeding she chose conventional measures..  She continues to use  Adderall for persistent symptoms of ADD, which has worked well for her and caused no concurrent problems.   She is continuing to use once daily metformin for management of obesity and PCOS because her sister has been able to lose weight using it and her health has improved.      Past Medical History  Diagnosis Date  . Migraine syndrome     once a month  . 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 Cesarean section    . Abdominal hysterectomy  jan 2012    endometriosis, inflammation, failed ablation  . Lipoma resection      left axilla, [facility]       The  following portions of the patient's history were reviewed and updated as appropriate: Allergies, current medications, and problem list.    Review of Systems:   12 Pt  review of systems was negative except those addressed in the HPI,     History   Social History  . Marital Status: Married    Spouse Name: N/A    Number of Children: N/A  . Years of Education: N/A   Occupational History  . Not on file.   Social History Main Topics  . Smoking status: Never Smoker   . Smokeless tobacco: Never Used  . Alcohol Use: Yes     Comment: OCCASIONAL  . Drug Use: No  . Sexual Activity: Not on file   Other Topics Concern  . Not on file   Social History Narrative  . No narrative on file    Objective:  Filed Vitals:   07/02/13 0810  BP: 110/72  Pulse: 89  Temp: 98.3 F (36.8 C)  Resp: 14     General appearance: alert, cooperative and appears stated age Ears: normal TM's and external ear canals both ears Throat: lips, mucosa, and tongue normal; teeth and gums normal Neck: no adenopathy, no carotid bruit, supple, symmetrical, trachea midline and thyroid not enlarged, symmetric, no tenderness/mass/nodules Back: symmetric, no curvature. ROM normal. No CVA tenderness.  Lungs: clear to auscultation bilaterally Heart: regular rate and rhythm, S1, S2 normal, no murmur, click, rub or gallop Abdomen: soft, non-tender; bowel sounds normal; no masses,  no organomegaly Pulses: 2+ and symmetric Skin: Skin color, texture, turgor normal. No rashes or lesions Lymph nodes: Cervical, supraclavicular, and axillary nodes normal.  Assessment and Plan:  Anal fissure Advised to avoid surgery at all costs by second surgery eval.  Advised to increase fiber in diet.   Obesity (BMI 30-39.9) I have addressed  BMI and recommended wt loss of 10% of body weigh over the next 6 months using a low glycemic index diet and regular exercise a minimum of 5 days per week.  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 ALPRAZolam (XANAX) 0.5 MG tablet TAKE 1 TABLET BY MOUTH EVERY NIGHT AT BEDTIME AS NEEDED  90 tablet  0  . amphetamine-dextroamphetamine (ADDERALL) 30 MG tablet Take 1 tablet (30 mg total) by mouth daily.  30 tablet  0  . amphetamine-dextroamphetamine (ADDERALL) 30 MG tablet Take 1 tablet (30 mg total) by mouth daily.  30 tablet  0  . amphetamine-dextroamphetamine (ADDERALL) 30 MG tablet Take 1 tablet (30 mg total) by mouth daily.  30 tablet  0  . amphetamine-dextroamphetamine (ADDERALL) 30 MG tablet Take 1 tablet (30 mg total) by mouth daily.  30 tablet  0  . buPROPion (WELLBUTRIN XL) 300 MG 24 hr tablet Take 1 tablet (300 mg total) by mouth daily.  90 tablet  0  . HYDROcodone-acetaminophen (NORCO/VICODIN) 5-325 MG per tablet Take 1 tablet by mouth every 6 (six) hours as needed for pain.  30 tablet  0  . HYDROcodone-acetaminophen (NORCO/VICODIN) 5-325 MG per tablet TAKE 1 TABLET BY MOUTH EVERY 6 HOURS AS NEEDED FOR PAIN  30 tablet  0  . metFORMIN (GLUCOPHAGE) 500 MG tablet Take 1 tablet (500 mg total) by mouth 2 (two) times daily with a meal.  180 tablet  0  . metroNIDAZOLE (METROGEL) 1 % gel Apply 1 application topically daily.  45 g  1  . [DISCONTINUED] amphetamine-dextroamphetamine (ADDERALL) 30 MG tablet Take 1 tablet (30 mg total) by mouth daily.  30 tablet  0  . [DISCONTINUED] amphetamine-dextroamphetamine (ADDERALL) 30 MG tablet Take 1 tablet (30 mg total) by mouth daily.  30 tablet  0  . [DISCONTINUED] amphetamine-dextroamphetamine (ADDERALL) 30 MG tablet Take 1 tablet (30 mg total) by mouth daily.  30 tablet  0  . rizatriptan (MAXALT) 10 MG tablet Take 1 tablet (10 mg total) by mouth as  needed. May repeat in 2 hours if needed  30 tablet  1  . spironolactone (ALDACTONE) 25 MG tablet Take 1 tablet (25 mg total) by mouth daily.  30 tablet  4   No facility-administered encounter medications on file as of 07/02/2013.

## 2013-07-02 NOTE — Assessment & Plan Note (Signed)
I have addressed  BMI and recommended wt loss of 10% of body weigh over the next 6 months using a low glycemic index diet and regular exercise a minimum of 5 days per week.  She has been screened for DM, thyroid in June.,

## 2013-07-02 NOTE — Assessment & Plan Note (Signed)
Trial of low dose spironolactone

## 2013-07-16 ENCOUNTER — Encounter: Payer: Self-pay | Admitting: Internal Medicine

## 2013-09-05 ENCOUNTER — Other Ambulatory Visit: Payer: Self-pay | Admitting: Internal Medicine

## 2013-09-06 ENCOUNTER — Other Ambulatory Visit: Payer: Self-pay | Admitting: *Deleted

## 2013-09-07 MED ORDER — ALPRAZOLAM 0.5 MG PO TABS: ORAL_TABLET | ORAL | Status: DC

## 2013-10-03 ENCOUNTER — Other Ambulatory Visit: Payer: Self-pay | Admitting: Internal Medicine

## 2013-10-03 MED ORDER — RIZATRIPTAN BENZOATE 10 MG PO TABS: 10.0000 mg | ORAL_TABLET | ORAL | Status: DC | PRN

## 2013-11-07 ENCOUNTER — Other Ambulatory Visit: Payer: Self-pay | Admitting: Internal Medicine

## 2013-11-22 ENCOUNTER — Telehealth: Payer: Self-pay | Admitting: *Deleted

## 2013-11-22 NOTE — Telephone Encounter (Signed)
Pt left VM, requesting refill on Adderall or if she needed an appointment first. Left message for pt, advising her to cal back and schedule an appointment since last visit was 9/14.

## 2013-11-28 ENCOUNTER — Telehealth: Payer: Self-pay | Admitting: Internal Medicine

## 2013-11-28 NOTE — Telephone Encounter (Signed)
Left message for pt to call office.  Returning her voice message from 11/26/13

## 2013-11-30 ENCOUNTER — Ambulatory Visit (INDEPENDENT_AMBULATORY_CARE_PROVIDER_SITE_OTHER): Payer: BC Managed Care – PPO | Admitting: Internal Medicine

## 2013-11-30 ENCOUNTER — Encounter: Payer: Self-pay | Admitting: Internal Medicine

## 2013-11-30 VITALS — BP 114/70 | HR 90 | Temp 98.1°F | Resp 16 | Wt 208.0 lb

## 2013-11-30 DIAGNOSIS — F329 Major depressive disorder, single episode, unspecified: Secondary | ICD-10-CM

## 2013-11-30 DIAGNOSIS — E785 Hyperlipidemia, unspecified: Secondary | ICD-10-CM

## 2013-11-30 DIAGNOSIS — E559 Vitamin D deficiency, unspecified: Secondary | ICD-10-CM

## 2013-11-30 DIAGNOSIS — F909 Attention-deficit hyperactivity disorder, unspecified type: Secondary | ICD-10-CM

## 2013-11-30 DIAGNOSIS — G43909 Migraine, unspecified, not intractable, without status migrainosus: Secondary | ICD-10-CM

## 2013-11-30 DIAGNOSIS — G43109 Migraine with aura, not intractable, without status migrainosus: Secondary | ICD-10-CM

## 2013-11-30 DIAGNOSIS — L650 Telogen effluvium: Secondary | ICD-10-CM

## 2013-11-30 DIAGNOSIS — F32A Depression, unspecified: Secondary | ICD-10-CM

## 2013-11-30 DIAGNOSIS — F3289 Other specified depressive episodes: Secondary | ICD-10-CM

## 2013-11-30 DIAGNOSIS — E669 Obesity, unspecified: Secondary | ICD-10-CM

## 2013-11-30 MED ORDER — AMPHETAMINE-DEXTROAMPHETAMINE 30 MG PO TABS
30.0000 mg | ORAL_TABLET | Freq: Every day | ORAL | Status: DC
Start: 2013-11-30 — End: 2014-03-19

## 2013-11-30 MED ORDER — SPIRONOLACTONE 25 MG PO TABS: 25.0000 mg | ORAL_TABLET | Freq: Every day | ORAL | Status: DC

## 2013-11-30 MED ORDER — METOPROLOL SUCCINATE ER 25 MG PO TB24: 25.0000 mg | ORAL_TABLET | Freq: Every day | ORAL | Status: DC

## 2013-11-30 MED ORDER — METFORMIN HCL 500 MG PO TABS
500.0000 mg | ORAL_TABLET | Freq: Every day | ORAL | Status: DC
Start: 2013-11-30 — End: 2013-12-13

## 2013-11-30 MED ORDER — AMPHETAMINE-DEXTROAMPHETAMINE 30 MG PO TABS
30.0000 mg | ORAL_TABLET | Freq: Every day | ORAL | Status: DC
Start: 2013-11-30 — End: 2014-07-05

## 2013-11-30 NOTE — Progress Notes (Signed)
Patient ID: Candice Vaughn, female   DOB: December 14, 1970, 43 y.o.   MRN: [id number]   Patient Active Problem List   Diagnosis Date Noted  . 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 Depression 01/14/2012  . Obesity (BMI 30-39.9) 01/14/2012  . Annual physical exam 01/14/2012  . 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 Migraine syndrome   . 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 Follow-up    HPI:   Candice Vaughn is a 43 y.o. female who presents for  Follow up on chronic conditions including migraine headaches and ADD .  She feels generally well but notes that her migraines have become more frequent, as many as 6 per month.  She [place] using sumatriptan about once or twice per week.  No particular pattern to the occurrence.  She is wondering if botox injections would help and is requesting a referral to  A  neurologist at [facility] who has helped a friend reduce her migraines.  She already receives botox injections for management of hyperhidrosis  As well as for cosmetic management of forehead wrinkles and is accustomed to the pain of the procedure.  Sh has had no prior trial of prophylactic medications and is willing to initiate therapy with beta blocker .  She continues to use Adderall daily for management of ADD.  Has not required an escalation of dosing.  She is finishing her MS degree at night while working full time as an Chief Executive Officer at [employer].    Past Medical History  Diagnosis Date  . Migraine syndrome     once a month  . 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 Cesarean section    . Abdominal hysterectomy  jan 2012    endometriosis, inflammation, failed ablation  . Lipoma resection      left axilla, [facility]       The following portions of the patient's history were reviewed and updated as appropriate: Allergies,  current medications, and problem list.    Review of Systems:   Patient denies headache, fevers, malaise, unintentional weight loss, skin rash, eye pain, sinus congestion and sinus pain, sore throat, dysphagia,  hemoptysis , cough, dyspnea, wheezing, chest pain, palpitations, orthopnea, edema, abdominal pain, nausea, melena, diarrhea, constipation, flank pain, dysuria, hematuria, urinary  Frequency, nocturia, numbness, tingling, seizures,  Focal weakness, Loss of consciousness,  Tremor, insomnia, depression, anxiety, and suicidal ideation.     History   Social History  . Marital Status: Married    Spouse Name: N/A    Number of Children: N/A  . Years of Education: N/A   Occupational History  . Not on file.   Social History Main Topics  . Smoking status: Never Smoker   . Smokeless tobacco: Never Used  . Alcohol Use: Yes     Comment: OCCASIONAL  . Drug Use: No  . Sexual Activity: Not on file   Other Topics Concern  . Not on file   Social History Narrative  . No narrative on file    Objective:  Filed Vitals:   11/30/13 0819  BP: 114/70  Pulse: 90  Temp: 98.1 F (36.7 C)  Resp: 16     General appearance: alert, cooperative and appears stated age Ears: normal TM's and external ear canals both ears Throat: lips, mucosa, and  tongue normal; teeth and gums normal Neck: no adenopathy, no carotid bruit, supple, symmetrical, trachea midline and thyroid not enlarged, symmetric, no tenderness/mass/nodules Back: symmetric, no curvature. ROM normal. No CVA tenderness. Lungs: clear to auscultation bilaterally Heart: regular rate and rhythm, S1, S2 normal, no murmur, click, rub or gallop Abdomen: soft, non-tender; bowel sounds normal; no masses,  no organomegaly Pulses: 2+ and symmetric Skin: Skin color, texture, turgor normal. No rashes or lesions Lymph nodes: Cervical, supraclavicular, and axillary nodes normal.  Assessment and Plan:  Obesity (BMI 30-39.9) I have  congratulated her in reduction of   BMI and encouraged  Continued weight loss with goal of 10% of body weight over the next 6 months using a low glycemic index diet and regular exercise a minimum of 5 days per week.    Depression Managed with wellbutrin.  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   Migraine syndrome Low dose beta blocker at bedtime prescribed today.  Referral to [facility] neurologist discussed and in process for consideration of Botox injections.    Updated Medication List Outpatient Encounter Prescriptions as of 11/30/2013  Medication Sig  . ALPRAZolam (XANAX) 0.5 MG tablet TAKE 1 TABLET BY MOUTH EVERY NIGHT AT BEDTIME AS NEEDED  . amphetamine-dextroamphetamine (ADDERALL) 30 MG tablet Take 1 tablet (30 mg total) by mouth daily.  Marland Kitchen. amphetamine-dextroamphetamine (ADDERALL) 30 MG tablet Take 1 tablet (30 mg total) by mouth daily.  Marland Kitchen. amphetamine-dextroamphetamine (ADDERALL) 30 MG tablet Take 1 tablet (30 mg total) by mouth daily.  Marland Kitchen. amphetamine-dextroamphetamine (ADDERALL) 30 MG tablet Take 1 tablet (30 mg total) by mouth daily.  Marland Kitchen. buPROPion (WELLBUTRIN XL) 300 MG 24 hr tablet TAKE 1 TABLET DAILY  . HYDROcodone-acetaminophen (NORCO/VICODIN) 5-325 MG per tablet Take 1 tablet by mouth every 6 (six) hours as needed for pain.  . metFORMIN (GLUCOPHAGE) 500 MG tablet Take 1 tablet (500 mg total) by mouth daily with breakfast.  . metroNIDAZOLE (METROGEL) 1 % gel Apply 1 application topically daily.  . rizatriptan (MAXALT) 10 MG tablet Take 1 tablet (10 mg total) by mouth as needed. May repeat in 2 hours if needed  . spironolactone (ALDACTONE) 25 MG tablet Take 1 tablet (25 mg total) by mouth daily.  . [DISCONTINUED] amphetamine-dextroamphetamine (ADDERALL) 30 MG tablet Take 1 tablet (30 mg total) by mouth daily.  . [DISCONTINUED] amphetamine-dextroamphetamine (ADDERALL) 30 MG tablet Take 1  tablet (30 mg total) by mouth daily.  . [DISCONTINUED] amphetamine-dextroamphetamine (ADDERALL) 30 MG tablet Take 1 tablet (30 mg total) by mouth daily.  . [DISCONTINUED] metFORMIN (GLUCOPHAGE) 500 MG tablet TAKE 1 TABLET TWICE DAILY  WITH MEALS  . [DISCONTINUED] spironolactone (ALDACTONE) 25 MG tablet Take 1 tablet (25 mg total) by mouth daily.  . metoprolol succinate (TOPROL-XL) 25 MG 24 hr tablet Take 1 tablet (25 mg total) by mouth daily.  . [DISCONTINUED] HYDROcodone-acetaminophen (NORCO/VICODIN) 5-325 MG per tablet TAKE 1 TABLET BY MOUTH EVERY 6 HOURS AS NEEDED FOR PAIN     Orders Placed This Encounter  Procedures  . CBC with Differential  . Comprehensive metabolic panel  . Lipid panel  . Vit D  25 hydroxy (rtn osteoporosis monitoring)  . TSH  . T4, free  . Ambulatory referral to Neurology    No Follow-up on file.

## 2013-11-30 NOTE — Patient Instructions (Signed)
We are adding toprol xl 25 mg daily at bedtime to prevent migraines  [facility]  Call if the toprol causes excessive fatigue

## 2013-12-01 ENCOUNTER — Encounter: Payer: Self-pay | Admitting: Internal Medicine

## 2013-12-01 NOTE — Assessment & Plan Note (Signed)
Managed with daily Adderall 30 mg,  Refills for 3 months given,.    

## 2013-12-01 NOTE — Assessment & Plan Note (Signed)
Managed with wellbutrin. No changes today  

## 2013-12-01 NOTE — Assessment & Plan Note (Signed)
Low dose beta blocker at bedtime prescribed today.  Referral to [facility] neurologist discussed and in process for consideration of Botox injections.

## 2013-12-01 NOTE — Assessment & Plan Note (Signed)
I have congratulated her in reduction of   BMI and encouraged  Continued weight loss with goal of 10% of body weight over the next 6 months using a low glycemic index diet and regular exercise a minimum of 5 days per week.     

## 2013-12-13 ENCOUNTER — Telehealth: Payer: Self-pay | Admitting: Internal Medicine

## 2013-12-13 ENCOUNTER — Other Ambulatory Visit: Payer: Self-pay | Admitting: *Deleted

## 2013-12-13 DIAGNOSIS — L650 Telogen effluvium: Secondary | ICD-10-CM

## 2013-12-13 MED ORDER — ALPRAZOLAM 0.5 MG PO TABS: ORAL_TABLET | ORAL | Status: DC

## 2013-12-13 MED ORDER — SPIRONOLACTONE 25 MG PO TABS: 25.0000 mg | ORAL_TABLET | Freq: Every day | ORAL | Status: DC

## 2013-12-13 MED ORDER — METFORMIN HCL 500 MG PO TABS: 500.0000 mg | ORAL_TABLET | Freq: Every day | ORAL | Status: DC

## 2013-12-13 NOTE — Telephone Encounter (Signed)
PT CALLED SHE NEEDS 90 DAY SUPPLY OF THE FOLLOW RX SHE WOULD LIKE THEM SENT TO [facility]    METFORMIN  (SHE STATED THEY STATED THEY DID NOT RECEIVE THIS) XANAX SPIRONOLACTONE 90 DAY SUPPLY   SHE WOULD LIKE TO GET A YEARS WORK OF RX OF THE ABOVE PT IS OUT OF Prudy FeelerXANAX

## 2013-12-13 NOTE — Telephone Encounter (Signed)
metformi and spironolactone requried labs every 6 months so I cannot do a years supply on refills,  Only 6 months,.  Ok to refill alprazolam for 6 months as well

## 2013-12-13 NOTE — Telephone Encounter (Signed)
I have refilled all but Xanax need your approval. Okay to fill?

## 2013-12-17 ENCOUNTER — Other Ambulatory Visit: Payer: Self-pay | Admitting: *Deleted

## 2013-12-28 ENCOUNTER — Encounter: Payer: BC Managed Care – PPO | Admitting: Internal Medicine

## 2014-03-19 ENCOUNTER — Other Ambulatory Visit: Payer: Self-pay | Admitting: *Deleted

## 2014-03-19 MED ORDER — AMPHETAMINE-DEXTROAMPHETAMINE 30 MG PO TABS: 30.0000 mg | ORAL_TABLET | Freq: Every day | ORAL | Status: DC

## 2014-03-19 NOTE — Telephone Encounter (Signed)
May, June and July dated rx's for adderall printed

## 2014-03-19 NOTE — Telephone Encounter (Signed)
Pt left VM, requesting 3 month worth of refills. Last visit 11/30/13

## 2014-03-19 NOTE — Telephone Encounter (Signed)
Pt notified Rx ready for pickup 

## 2014-04-08 ENCOUNTER — Other Ambulatory Visit: Payer: Self-pay | Admitting: Internal Medicine

## 2014-05-04 ENCOUNTER — Other Ambulatory Visit: Payer: Self-pay | Admitting: Internal Medicine

## 2014-05-06 NOTE — Telephone Encounter (Signed)
Last OV 2.6.15.  Please advise refills.

## 2014-05-10 NOTE — Telephone Encounter (Signed)
rx sent to pharmacy

## 2014-05-10 NOTE — Telephone Encounter (Signed)
Refill one 60 days ;  needs office visit in Aug/Sept

## 2014-06-03 ENCOUNTER — Ambulatory Visit: Payer: Self-pay | Admitting: Family Medicine

## 2014-06-03 LAB — URINALYSIS, COMPLETE
Glucose,UR: NEGATIVE mg/dL (ref 0–75)
Ketone: NEGATIVE
Nitrite: POSITIVE
Ph: 6 (ref 4.5–8.0)
Protein: 300
Specific Gravity: >=1.03 (ref 1.003–1.030)

## 2014-06-20 DIAGNOSIS — N201 Calculus of ureter: Secondary | ICD-10-CM | POA: Insufficient documentation

## 2014-06-20 HISTORY — DX: Calculus of ureter: N20.1

## 2014-07-03 ENCOUNTER — Other Ambulatory Visit: Payer: Self-pay | Admitting: Internal Medicine

## 2014-07-04 NOTE — Telephone Encounter (Signed)
Rx faxed to pharmacy  

## 2014-07-04 NOTE — Telephone Encounter (Signed)
Last refill 6.15.15, last OV 2.6.15.  Please advise refill.

## 2014-07-04 NOTE — Telephone Encounter (Signed)
90 day supply authorized  For #90 alprazolam

## 2014-07-05 ENCOUNTER — Telehealth: Payer: Self-pay | Admitting: *Deleted

## 2014-07-05 MED ORDER — AMPHETAMINE-DEXTROAMPHETAMINE 30 MG PO TABS: 30.0000 mg | ORAL_TABLET | Freq: Every day | ORAL | Status: DC

## 2014-07-05 NOTE — Telephone Encounter (Signed)
Pt called, requesting 3 month refills of Adderall. Notified pt last OV was in February, she would be due a follow up appointment. Appointment scheduled 07/16/14. Wants to know if will give a refill prior to appointment?

## 2014-07-05 NOTE — Telephone Encounter (Signed)
Yes, id you will print out the refills x 3 I will sign them ahead of her 09/22 appt

## 2014-07-05 NOTE — Telephone Encounter (Signed)
Pt notified Rxs ready for pick up 07/08/14

## 2014-07-16 ENCOUNTER — Ambulatory Visit: Payer: BC Managed Care – PPO | Admitting: Internal Medicine

## 2014-07-29 ENCOUNTER — Ambulatory Visit (INDEPENDENT_AMBULATORY_CARE_PROVIDER_SITE_OTHER): Payer: BC Managed Care – PPO | Admitting: Internal Medicine

## 2014-07-29 DIAGNOSIS — F900 Attention-deficit hyperactivity disorder, predominantly inattentive type: Secondary | ICD-10-CM

## 2014-07-31 NOTE — Progress Notes (Signed)
Patient ID: Candice Vaughn, female   DOB: 1970-12-29, 43 y.o.   MRN: [id number]  patient no showed for med refill visit

## 2014-07-31 NOTE — Assessment & Plan Note (Signed)
Patient no showed

## 2014-08-04 ENCOUNTER — Other Ambulatory Visit: Payer: Self-pay | Admitting: Internal Medicine

## 2014-09-09 ENCOUNTER — Other Ambulatory Visit: Payer: Self-pay | Admitting: Internal Medicine

## 2014-09-24 ENCOUNTER — Other Ambulatory Visit: Payer: Self-pay | Admitting: Internal Medicine

## 2014-10-04 ENCOUNTER — Ambulatory Visit (INDEPENDENT_AMBULATORY_CARE_PROVIDER_SITE_OTHER): Payer: BC Managed Care – PPO | Admitting: Internal Medicine

## 2014-10-04 ENCOUNTER — Encounter: Payer: Self-pay | Admitting: Internal Medicine

## 2014-10-04 ENCOUNTER — Encounter (INDEPENDENT_AMBULATORY_CARE_PROVIDER_SITE_OTHER): Payer: Self-pay

## 2014-10-04 VITALS — BP 114/76 | HR 91 | Temp 98.5°F | Resp 16 | Ht 65.0 in | Wt 211.0 lb

## 2014-10-04 DIAGNOSIS — R5382 Chronic fatigue, unspecified: Secondary | ICD-10-CM

## 2014-10-04 DIAGNOSIS — F900 Attention-deficit hyperactivity disorder, predominantly inattentive type: Secondary | ICD-10-CM

## 2014-10-04 DIAGNOSIS — L74519 Primary focal hyperhidrosis, unspecified: Secondary | ICD-10-CM

## 2014-10-04 DIAGNOSIS — E669 Obesity, unspecified: Secondary | ICD-10-CM

## 2014-10-04 DIAGNOSIS — Z90710 Acquired absence of both cervix and uterus: Secondary | ICD-10-CM | POA: Insufficient documentation

## 2014-10-04 DIAGNOSIS — G43109 Migraine with aura, not intractable, without status migrainosus: Secondary | ICD-10-CM

## 2014-10-04 DIAGNOSIS — Z1239 Encounter for other screening for malignant neoplasm of breast: Secondary | ICD-10-CM

## 2014-10-04 DIAGNOSIS — F909 Attention-deficit hyperactivity disorder, unspecified type: Secondary | ICD-10-CM

## 2014-10-04 DIAGNOSIS — R61 Generalized hyperhidrosis: Secondary | ICD-10-CM | POA: Insufficient documentation

## 2014-10-04 DIAGNOSIS — G43909 Migraine, unspecified, not intractable, without status migrainosus: Secondary | ICD-10-CM

## 2014-10-04 DIAGNOSIS — E785 Hyperlipidemia, unspecified: Secondary | ICD-10-CM

## 2014-10-04 LAB — COMPREHENSIVE METABOLIC PANEL
ALT: 27 U/L (ref 0–35)
AST: 19 U/L (ref 0–37)
Albumin: 4.1 g/dL (ref 3.5–5.2)
Alkaline Phosphatase: 28 U/L — ABNORMAL LOW (ref 39–117)
BILIRUBIN TOTAL: 0.5 mg/dL (ref 0.2–1.2)
BUN: 21 mg/dL (ref 6–23)
CO2: 24 meq/L (ref 19–32)
Calcium: 10 mg/dL (ref 8.4–10.5)
Chloride: 107 mEq/L (ref 96–112)
Creatinine, Ser: 0.8 mg/dL (ref 0.4–1.2)
GFR: 85.98 mL/min (ref >60.00)
Glucose, Bld: 92 mg/dL (ref 70–99)
Potassium: 4.5 mEq/L (ref 3.5–5.1)
SODIUM: 137 meq/L (ref 135–145)
Total Protein: 6.9 g/dL (ref 6.0–8.3)

## 2014-10-04 LAB — CBC WITH DIFFERENTIAL/PLATELET
BASOS ABS: 0 10*3/uL (ref 0.0–0.1)
Basophils Relative: 0.3 % (ref 0.0–3.0)
EOS ABS: 0 10*3/uL (ref 0.0–0.7)
Eosinophils Relative: 0.5 % (ref 0.0–5.0)
HEMATOCRIT: 40.6 % (ref 36.0–46.0)
Hemoglobin: 13.5 g/dL (ref 12.0–15.0)
Lymphocytes Relative: 22.1 % (ref 12.0–46.0)
Lymphs Abs: 2 10*3/uL (ref 0.7–4.0)
MCHC: 33.3 g/dL (ref 30.0–36.0)
MCV: 92.2 fl (ref 78.0–100.0)
Monocytes Absolute: 0.5 10*3/uL (ref 0.1–1.0)
Monocytes Relative: 5.7 % (ref 3.0–12.0)
NEUTROS ABS: 6.4 10*3/uL (ref 1.4–7.7)
Neutrophils Relative %: 71.4 % (ref 43.0–77.0)
PLATELETS: 282 10*3/uL (ref 150.0–400.0)
RBC: 4.41 Mil/uL (ref 3.87–5.11)
RDW: 13.4 % (ref 11.5–15.5)
WBC: 9 10*3/uL (ref 4.0–10.5)

## 2014-10-04 LAB — LIPID PANEL
CHOLESTEROL: 159 mg/dL (ref 0–200)
HDL: 64.7 mg/dL (ref >39.00)
LDL Cholesterol: 81 mg/dL (ref 0–99)
NonHDL: 94.3
Total CHOL/HDL Ratio: 2
Triglycerides: 68 mg/dL (ref 0.0–149.0)
VLDL: 13.6 mg/dL (ref 0.0–40.0)

## 2014-10-04 LAB — TSH: TSH: 1.91 u[IU]/mL (ref 0.35–4.50)

## 2014-10-04 MED ORDER — AMPHETAMINE-DEXTROAMPHETAMINE 30 MG PO TABS: 30.0000 mg | ORAL_TABLET | Freq: Every day | ORAL | Status: DC

## 2014-10-04 NOTE — Progress Notes (Signed)
Patient ID: Candice Vaughn, female   DOB: [birth year], 43 y.o.   MRN: [id number]  Patient Active Problem List   Diagnosis Date Noted  . S/P total hysterectomy 10/04/2014  . 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 Depression 01/14/2012  . Obesity (BMI 30-39.9) 01/14/2012  . Annual physical exam 01/14/2012  . 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 Migraine syndrome   . 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 Follow-up    med refill, patient is fasting.    HPI:   Candice Bolizabeth Ilg is a 43 y.o. female who presents for  6 month follow up on chronic issues including ADD managed with medication, chronic migraine syndrome,  And hyperhidrosis.   She is functioning well on current medications and has not required early  refills. sleeping well.   Her pattern of migraine occurrence has been  improving with massage and avoidance of red wine .  Did not tolerate a trial of topamax due to occurrence of bilateral renal calculi which were passed spontaneously .  Since stopping the topamax she has had no recurrence of kidney stones.    She has received botox injections for treatment of hyperhidrosis by a neurologist which has helped tremendously.    Past Medical History  Diagnosis Date  . Migraine syndrome     once a month  . 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 Cesarean section    . Abdominal hysterectomy  jan 2012    endometriosis, inflammation, failed ablation  . Lipoma resection      left axilla, [facility]       The following portions of the patient's history were reviewed and updated as appropriate: Allergies, current medications, and problem list.    Review of Systems:   Patient denies headache, fevers, malaise, unintentional weight loss, skin rash, eye pain, sinus congestion  and sinus pain, sore throat, dysphagia,  hemoptysis , cough, dyspnea, wheezing, chest pain, palpitations, orthopnea, edema, abdominal pain, nausea, melena, diarrhea, constipation, flank pain, dysuria, hematuria, urinary  Frequency, nocturia, numbness, tingling, seizures,  Focal weakness, Loss of consciousness,  Tremor, insomnia, depression, anxiety, and suicidal ideation.     History   Social History  . Marital Status: Married    Spouse Name: N/A    Number of Children: N/A  . Years of Education: N/A   Occupational History  . Not on file.   Social History Main Topics  . Smoking status: Never Smoker   . Smokeless tobacco: Never Used  . Alcohol Use: Yes     Comment: OCCASIONAL  . Drug Use: No  . Sexual Activity: Not on file   Other Topics Concern  . Not on file   Social History Narrative    Objective:  Filed Vitals:   10/04/14 0807  BP: 114/76  Pulse: 91  Temp: 98.5 F (36.9 C)  Resp: 16     General appearance: alert, cooperative and appears stated age Ears: normal TM's and external ear canals both ears Throat: lips, mucosa, and tongue normal; teeth and gums normal Neck: no adenopathy, no carotid bruit, supple, symmetrical, trachea midline and thyroid not enlarged, symmetric, no tenderness/mass/nodules Back: symmetric, no curvature. ROM normal. No CVA tenderness. Lungs: clear to auscultation bilaterally Heart: regular rate and rhythm, S1, S2 normal, no murmur,  click, rub or gallop Abdomen: soft, non-tender; bowel sounds normal; no masses,  no organomegaly Pulses: 2+ and symmetric Skin: Skin color, texture, turgor normal. No rashes or lesions Lymph nodes: Cervical, supraclavicular, and axillary nodes normal.  Assessment and Plan: Problem List Items Addressed This Visit      Cardiovascular and Mediastinum   Migraine syndrome    Managed with dietary restrictions and massage for prevention      Musculoskeletal and Integument   Hyperhidrosis    Managed now with  botox injections every 90 days [facility]        Other   Obesity (BMI 30-39.9)    I have addressed  BMI and recommended a low glycemic index diet utilizing smaller more frequent meals to increase metabolism.  I have also recommended that patient start exercising with a goal of 30 minutes of aerobic exercise a minimum of 5 days per week. Screening for lipid disorders, thyroid and diabetes to be done today.  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       S/P total hysterectomy - Primary    Other Visit Diagnoses    Screening for breast cancer        Relevant Orders       MM DIGITAL SCREENING BILATERAL    Chronic fatigue        Relevant Orders       CBC with Differential (Completed)       Comprehensive metabolic panel (Completed)       TSH (Completed)    Hyperlipidemia        Relevant Orders       Lipid panel (Completed)

## 2014-10-04 NOTE — Progress Notes (Signed)
Pre-visit discussion using our clinic review tool. No additional management support is needed unless otherwise documented below in the visit note.  

## 2014-10-04 NOTE — Patient Instructions (Signed)
You are doing well  We will do your annual exam in the [facility]

## 2014-10-06 NOTE — Assessment & Plan Note (Signed)
Managed with dietary restrictions and massage for prevention

## 2014-10-06 NOTE — Assessment & Plan Note (Signed)
Managed with daily Adderall 30 mg,  Refills for 3 months given,.

## 2014-10-06 NOTE — Assessment & Plan Note (Signed)
I have addressed  BMI and recommended a low glycemic index diet utilizing smaller more frequent meals to increase metabolism.  I have also recommended that patient start exercising with a goal of 30 minutes of aerobic exercise a minimum of 5 days per week. Screening for lipid disorders, thyroid and diabetes to be done today.  Lab Results  Component Value Date   TSH 1.91 10/04/2014   Lab Results  Component Value Date   NA 137 10/04/2014   K 4.5 10/04/2014   CL 107 10/04/2014   CO2 24 10/04/2014   Lab Results  Component Value Date   CHOL 159 10/04/2014   HDL 64.70 10/04/2014   LDLCALC 81 10/04/2014   TRIG 68.0 10/04/2014   CHOLHDL 2 10/04/2014

## 2014-10-06 NOTE — Assessment & Plan Note (Signed)
Managed now with botox injections every 90 days [facility]   

## 2014-10-07 ENCOUNTER — Encounter: Payer: Self-pay | Admitting: *Deleted

## 2014-10-14 ENCOUNTER — Other Ambulatory Visit: Payer: Self-pay | Admitting: Internal Medicine

## 2014-10-14 LAB — HM MAMMOGRAPHY: HM Mammogram: NEGATIVE

## 2014-10-14 NOTE — Telephone Encounter (Signed)
Last OV 10/04/14

## 2014-10-15 ENCOUNTER — Encounter: Payer: Self-pay | Admitting: *Deleted

## 2014-10-15 NOTE — Telephone Encounter (Signed)
Ok to refill,  printed rx  

## 2014-10-16 NOTE — Telephone Encounter (Signed)
Faxed

## 2014-10-25 HISTORY — PX: LAPAROSCOPIC GASTRIC SLEEVE RESECTION: SHX5895

## 2014-11-08 ENCOUNTER — Other Ambulatory Visit: Payer: Self-pay | Admitting: *Deleted

## 2014-11-08 MED ORDER — BUPROPION HCL ER (XL) 300 MG PO TB24: 300.0000 mg | ORAL_TABLET | Freq: Every day | ORAL | Status: DC

## 2014-11-19 ENCOUNTER — Encounter: Payer: Self-pay | Admitting: Internal Medicine

## 2014-12-31 ENCOUNTER — Other Ambulatory Visit: Payer: Self-pay | Admitting: Internal Medicine

## 2015-01-17 ENCOUNTER — Encounter: Payer: BC Managed Care – PPO | Admitting: Internal Medicine

## 2015-01-31 ENCOUNTER — Encounter: Payer: Self-pay | Admitting: Internal Medicine

## 2015-01-31 ENCOUNTER — Ambulatory Visit (INDEPENDENT_AMBULATORY_CARE_PROVIDER_SITE_OTHER): Payer: BLUE CROSS/BLUE SHIELD | Admitting: Internal Medicine

## 2015-01-31 VITALS — BP 122/80 | HR 94 | Temp 97.9°F | Resp 14 | Ht 65.5 in | Wt 218.0 lb

## 2015-01-31 DIAGNOSIS — Z0000 Encounter for general adult medical examination without abnormal findings: Secondary | ICD-10-CM

## 2015-01-31 DIAGNOSIS — E669 Obesity, unspecified: Secondary | ICD-10-CM

## 2015-01-31 DIAGNOSIS — F329 Major depressive disorder, single episode, unspecified: Secondary | ICD-10-CM | POA: Diagnosis not present

## 2015-01-31 DIAGNOSIS — F900 Attention-deficit hyperactivity disorder, predominantly inattentive type: Secondary | ICD-10-CM | POA: Diagnosis not present

## 2015-01-31 DIAGNOSIS — F32A Depression, unspecified: Secondary | ICD-10-CM

## 2015-01-31 DIAGNOSIS — F909 Attention-deficit hyperactivity disorder, unspecified type: Secondary | ICD-10-CM

## 2015-01-31 MED ORDER — AMPHETAMINE-DEXTROAMPHETAMINE 30 MG PO TABS: 30.0000 mg | ORAL_TABLET | Freq: Every day | ORAL | Status: DC

## 2015-01-31 MED ORDER — HYDROCODONE-ACETAMINOPHEN 5-325 MG PO TABS
1.0000 | ORAL_TABLET | Freq: Four times a day (QID) | ORAL | Status: DC | PRN
Start: 2015-01-31 — End: 2016-06-21

## 2015-01-31 NOTE — Progress Notes (Signed)
Patient ID: Candice Vaughn, female   DOB: [birth year], 44 y.o.   MRN: [id number]   Subjective:     Candice Vaughn is a 44 y.o. female and is here for a comprehensive physical exam. The patient reports problems - losing weight . She has not been exercising since her schedule has now included working on her master's degree..  She Has gained back 10 lbs and is frustrated.  Sh eis already taking a stimulant for management of ADD . No insomina issues,  Migraines are infrequent.   History   Social History  . Marital Status: Married    Spouse Name: N/A  . Number of Children: N/A  . Years of Education: N/A   Occupational History  . Not on file.   Social History Main Topics  . Smoking status: Never Smoker   . Smokeless tobacco: Never Used  . Alcohol Use: Yes     Comment: OCCASIONAL  . Drug Use: No  . Sexual Activity: Not on file   Other Topics Concern  . Not on file   Social History Narrative   Health Maintenance  Topic Date Due  . HIV Screening  07/10/1987  . PAP SMEAR  05/01/2013  . INFLUENZA VACCINE  05/26/2015  . TETANUS/TDAP  01/08/2018    The following portions of the patient's history were reviewed and updated as appropriate: allergies, current medications, past family history, past medical history, past social history, past surgical history and problem list.  Review of Systems A comprehensive review of systems was negative.   Objective:  BP 122/80 mmHg  Pulse 94  Temp(Src) 97.9 F (36.6 C) (Oral)  Resp 14  Ht 5' 5.5" (1.664 m)  Wt 218 lb (98.884 kg)  BMI 35.71 kg/m2  SpO2 99%   General appearance: alert, cooperative and appears stated age Head: Normocephalic, without obvious abnormality, atraumatic Eyes: conjunctivae/corneas clear. PERRL, EOM's intact. Fundi benign. Ears: normal TM's and external ear canals both ears Nose: Nares normal. Septum midline. Mucosa normal. No drainage or sinus tenderness. Throat: lips, mucosa, and tongue normal; teeth and gums  normal Neck: no adenopathy, no carotid bruit, no JVD, supple, symmetrical, trachea midline and thyroid not enlarged, symmetric, no tenderness/mass/nodules Lungs: clear to auscultation bilaterally Breasts: normal appearance, no masses or tenderness Heart: regular rate and rhythm, S1, S2 normal, no murmur, click, rub or gallop Abdomen: soft, non-tender; bowel sounds normal; no masses,  no organomegaly Extremities: extremities normal, atraumatic, no cyanosis or edema Pulses: 2+ and symmetric Skin: Skin color, texture, turgor normal. No rashes or lesions Neurologic: Alert and oriented X 3, normal strength and tone. Normal symmetric reflexes. Normal coordination and gait.    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  No changes today        Obesity (BMI 30-39.9)    She has regained 10 lbs over the past year. I have addressed  BMI and recommended a low glycemic index diet  utilizing smaller more frequent meals to increase metabolism.  I have also addressed her issues with time management, since she was unable to make her 1"30 appt on time  rescheduled  and was again late for her 4:30 appt today.  recommended that patient start exercising with a goal of 30 minutes of aerobic exercise a minimum of 5 days per week.   Wt Readings from Last 3 Encounters:  01/31/15 218 lb (98.884 kg)  10/04/14 211 lb (95.709 kg)  11/30/13 208 lb (94.348 kg)          Relevant Medications   amphetamine-dextroamphetamine (ADDERALL) 30 MG tablet   amphetamine-dextroamphetamine (ADDERALL) 30 MG tablet   amphetamine-dextroamphetamine (ADDERALL) 30 MG tablet   Annual physical exam    Annual  wellness  exam was done as well as a comprehensive physical exam and management of acute and chronic conditions .  During the course of the visit the patient was educated and counseled about appropriate screening and preventive services including :  diabetes screening, lipid analysis with projected  10 year  risk for CAD , nutrition counseling, colorectal cancer screening, and recommended immunizations.  Printed recommendations for health maintenance screenings was given.         A total of 45 minutes was spent with patient more than half of which was spent in counseling patient on the above mentioned issues , reviewing and explaining recent labs and imaging studies done, and coordination of care.

## 2015-01-31 NOTE — Patient Instructions (Signed)

## 2015-01-31 NOTE — Progress Notes (Signed)
Pre visit review using our clinic review tool, if applicable. No additional management support is needed unless otherwise documented below in the visit note. 

## 2015-02-02 NOTE — Assessment & Plan Note (Signed)
She has regained 10 lbs over the past year. I have addressed  BMI and recommended a low glycemic index diet utilizing smaller more frequent meals to increase metabolism.  I have also recommended that patient start exercising with a goal of 30 minutes of aerobic exercise a minimum of 5 days per week.   Wt Readings from Last 3 Encounters:  01/31/15 218 lb (98.884 kg)  10/04/14 211 lb (95.709 kg)  11/30/13 208 lb (94.348 kg)

## 2015-02-02 NOTE — Assessment & Plan Note (Signed)
Symptoms have been well Managed with wellbutrin.  No changes today     

## 2015-02-02 NOTE — Assessment & Plan Note (Signed)

## 2015-02-02 NOTE — Assessment & Plan Note (Signed)
Er symptoms have been managed with daily Adderall 30 mg, and she i s tolerating the medication without Refills for 3 months given,

## 2015-02-06 ENCOUNTER — Other Ambulatory Visit: Payer: Self-pay | Admitting: Internal Medicine

## 2015-05-15 ENCOUNTER — Other Ambulatory Visit: Payer: Self-pay | Admitting: Internal Medicine

## 2015-06-02 ENCOUNTER — Telehealth: Payer: Self-pay | Admitting: Internal Medicine

## 2015-06-02 MED ORDER — AMPHETAMINE-DEXTROAMPHETAMINE 30 MG PO TABS: 30.0000 mg | ORAL_TABLET | Freq: Every day | ORAL | Status: DC

## 2015-06-02 MED ORDER — AMPHETAMINE-DEXTROAMPHETAMINE 30 MG PO TABS
30.0000 mg | ORAL_TABLET | Freq: Every day | ORAL | Status: DC
Start: 2015-06-02 — End: 2015-09-01

## 2015-06-02 NOTE — Telephone Encounter (Signed)
Pt aware Rx printed, appoint scheduled

## 2015-06-02 NOTE — Telephone Encounter (Signed)
Refill on amphetamine-dextroamphetamine (ADDERALL) 30 MG tablet

## 2015-06-02 NOTE — Telephone Encounter (Signed)
Last OV 4.8.16, last refill 7.8.16.  Please advise 3 month refill

## 2015-06-02 NOTE — Telephone Encounter (Signed)
90 day supply authorized  And printed.  Needs appt in November

## 2015-07-04 DIAGNOSIS — F32A Depression, unspecified: Secondary | ICD-10-CM | POA: Insufficient documentation

## 2015-07-26 ENCOUNTER — Other Ambulatory Visit: Payer: Self-pay | Admitting: Internal Medicine

## 2015-07-28 ENCOUNTER — Other Ambulatory Visit: Payer: Self-pay | Admitting: Internal Medicine

## 2015-07-28 NOTE — Telephone Encounter (Signed)
Refill for 30 days  WITH 1 REFILL

## 2015-07-28 NOTE — Telephone Encounter (Signed)
Last OV 4.8.16, next OV 11.7.16. Please advise refill

## 2015-07-29 NOTE — Telephone Encounter (Signed)
Rx given to [facility], and faxed 10.3.16

## 2015-09-01 ENCOUNTER — Encounter: Payer: Self-pay | Admitting: Internal Medicine

## 2015-09-01 ENCOUNTER — Ambulatory Visit (INDEPENDENT_AMBULATORY_CARE_PROVIDER_SITE_OTHER): Payer: BLUE CROSS/BLUE SHIELD | Admitting: Internal Medicine

## 2015-09-01 VITALS — BP 110/70 | HR 69 | Temp 97.9°F | Resp 12 | Ht 65.0 in | Wt 217.2 lb

## 2015-09-01 DIAGNOSIS — F900 Attention-deficit hyperactivity disorder, predominantly inattentive type: Secondary | ICD-10-CM | POA: Diagnosis not present

## 2015-09-01 DIAGNOSIS — E669 Obesity, unspecified: Secondary | ICD-10-CM | POA: Diagnosis not present

## 2015-09-01 DIAGNOSIS — F330 Major depressive disorder, recurrent, mild: Secondary | ICD-10-CM

## 2015-09-01 DIAGNOSIS — M26609 Unspecified temporomandibular joint disorder, unspecified side: Secondary | ICD-10-CM

## 2015-09-01 DIAGNOSIS — F909 Attention-deficit hyperactivity disorder, unspecified type: Secondary | ICD-10-CM

## 2015-09-01 MED ORDER — VITAMIN D3 50 MCG (2000 UT) PO CAPS: 2000.0000 [IU] | ORAL_CAPSULE | Freq: Every day | ORAL | Status: DC

## 2015-09-01 MED ORDER — CYCLOBENZAPRINE HCL 5 MG PO TABS: 5.0000 mg | ORAL_TABLET | Freq: Three times a day (TID) | ORAL | Status: DC | PRN

## 2015-09-01 MED ORDER — AMPHETAMINE-DEXTROAMPHETAMINE 30 MG PO TABS: 30.0000 mg | ORAL_TABLET | Freq: Every day | ORAL | Status: DC

## 2015-09-01 NOTE — Progress Notes (Signed)
Pre visit review using our clinic review tool, if applicable. No additional management support is needed unless otherwise documented below in the visit note. 

## 2015-09-01 NOTE — Progress Notes (Signed)
Subjective:  Patient ID: Candice Vaughn, female    DOB: 1970-11-04  Age: 44 y.o. 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 Diagnoses of Mild episode of recurrent major depressive disorder (HCC), Obesity (BMI 30-39.9), and TMJ (temporomandibular joint syndrome) were also pertinent to this visit.  HPI Candice Vaughn presents for  follow up onc hronic issues including ADD and obesity. .  She was Last seen in April for annual exam and at that time was frustrated by continued weight gain due to decreased physical activity complicated by multiple time commitments to work and study schedule .  Since her last visit [facility] and has decided to  Have bariatric  SURGERY using the gastric sleeve procedure.  No date has been set yet,    [facility] today.  Cc today:  TMJ pain.  Left > right.   BOTOX HELPED TRANSIENTLY.    Outpatient Prescriptions Prior to Visit  Medication Sig Dispense Refill  . ALPRAZolam (XANAX) 0.5 MG tablet TAKE 1 TABLET BY MOUTH AT BEDTIME AS NEEDED 30 tablet 1  . buPROPion (WELLBUTRIN XL) 300 MG 24 hr tablet TAKE 1 TABLET (300 MG TOTAL) BY MOUTH DAILY. 90 tablet 1  . HYDROcodone-acetaminophen (NORCO/VICODIN) 5-325 MG per tablet Take 1 tablet by mouth every 6 (six) hours as needed for pain. 30 tablet 0  . HYDROcodone-acetaminophen (NORCO/VICODIN) 5-325 MG per tablet Take 1 tablet by mouth every 6 (six) hours as needed for moderate pain. 30 tablet 0  . metFORMIN (GLUCOPHAGE) 500 MG tablet TAKE 1 TABLET (500 MG TOTAL) BY MOUTH DAILY WITH BREAKFAST. 90 tablet 2  . metroNIDAZOLE (METROGEL) 1 % gel Apply 1 application topically daily. 45 g 1  . rizatriptan (MAXALT) 10 MG tablet TAKE 1 TABLET BY MOUTH AS NEEDED MAY REPEAT IN 2 HOURS IF NEEDED 10 tablet 2  . spironolactone (ALDACTONE) 25 MG tablet TAKE 1 TABLET (25 MG TOTAL) BY MOUTH  DAILY. 90 tablet 3  . amphetamine-dextroamphetamine (ADDERALL) 30 MG tablet Take 1 tablet by mouth daily. 30 tablet 0  . amphetamine-dextroamphetamine (ADDERALL) 30 MG tablet Take 1 tablet by mouth daily. 30 tablet 0  . amphetamine-dextroamphetamine (ADDERALL) 30 MG tablet Take 1 tablet by mouth daily. 30 tablet 0   No facility-administered medications prior to visit.    Review of Systems;  Patient denies headache, fevers, malaise, unintentional weight loss, skin rash, eye pain, sinus congestion and sinus pain, sore throat, dysphagia,  hemoptysis , cough, dyspnea, wheezing, chest pain, palpitations, orthopnea, edema, abdominal pain, nausea, melena, diarrhea, constipation, flank pain, dysuria, hematuria, urinary  Frequency, nocturia, numbness, tingling, seizures,  Focal weakness, Loss of consciousness,  Tremor, insomnia, depression, anxiety, and suicidal ideation.      Objective:  BP 110/70 mmHg  Pulse 69  Temp(Src) 97.9 F (36.6 C) (Oral)  Resp 12  Ht 5\' 5"  (1.651 m)  Wt 217 lb 3.2 oz (98.521 kg)  BMI 36.14 kg/m2  SpO2 98%  BP Readings from Last 3 Encounters:  09/01/15 110/70  01/31/15 122/80  10/04/14 114/76    Wt Readings from Last 3 Encounters:  09/01/15 217 lb 3.2 oz (98.521 kg)  01/31/15 218 lb (98.884 kg)  10/04/14 211 lb (95.709 kg)    General appearance: alert, cooperative and appears stated age Ears: normal TM's and external ear canals both ears Throat: lips, mucosa, and tongue normal; teeth and gums normal  Neck: no adenopathy, no carotid bruit, supple, symmetrical, trachea midline and thyroid not enlarged, symmetric, no tenderness/mass/nodules Back: symmetric, no curvature. ROM normal. No CVA tenderness. Lungs: clear to auscultation bilaterally Heart: regular rate and rhythm, S1, S2 normal, no murmur, click, rub or gallop Abdomen: soft, non-tender; bowel sounds normal; no masses,  no organomegaly Pulses: 2+ and symmetric Skin: Skin color, texture, turgor  normal. No rashes or lesions Lymph nodes: Cervical, supraclavicular, and axillary nodes normal.  No results found for: HGBA1C  Lab Results  Component Value Date   CREATININE 0.8 10/04/2014   CREATININE 0.71 09/16/2011    Lab Results  Component Value Date   WBC 9.0 10/04/2014   HGB 13.5 10/04/2014   HCT 40.6 10/04/2014   PLT 282.0 10/04/2014   GLUCOSE 92 10/04/2014   CHOL 159 10/04/2014   TRIG 68.0 10/04/2014   HDL 64.70 10/04/2014   LDLCALC 81 10/04/2014   ALT 27 10/04/2014   AST 19 10/04/2014   NA 137 10/04/2014   K 4.5 10/04/2014   CL 107 10/04/2014   CREATININE 0.8 10/04/2014   BUN 21 10/04/2014   CO2 24 10/04/2014   TSH 1.91 10/04/2014    No results found.  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  No changes today          Obesity (BMI 30-39.9)    She has decided to undergo gastric sleeve surgery by the [facility] and evaluation is on going.  A total of 25 minutes of face to face time was spent with patient more than half of which was spent in counselling about the above mentioned condition        Relevant Medications   amphetamine-dextroamphetamine (ADDERALL) 30 MG tablet   amphetamine-dextroamphetamine (ADDERALL) 30 MG tablet   amphetamine-dextroamphetamine (ADDERALL) 30 MG tablet   TMJ (temporomandibular joint syndrome)    Advised to try motrin plus Muscle relaxer.  dietary adjustments advised. Handout given.          I am having Ms. Timmins start on Vitamin D3 and cyclobenzaprine. I am also having her maintain her HYDROcodone-acetaminophen, metroNIDAZOLE, rizatriptan, spironolactone,  HYDROcodone-acetaminophen, metFORMIN, buPROPion, ALPRAZolam, amphetamine-dextroamphetamine, amphetamine-dextroamphetamine, and amphetamine-dextroamphetamine.  Meds ordered this encounter  Medications  . Cholecalciferol (VITAMIN D3) 2000 UNITS capsule    Sig: Take 1 capsule (2,000 Units total) by mouth daily.    Dispense:  90 capsule    Refill:  0  . cyclobenzaprine (FLEXERIL) 5 MG tablet    Sig: Take 1 tablet (5 mg total) by mouth 3 (three) times daily as needed for muscle spasms.    Dispense:  90 tablet    Refill:  1  . amphetamine-dextroamphetamine (ADDERALL) 30 MG tablet    Sig: Take 1 tablet by mouth daily.    Dispense:  30 tablet    Refill:  0    Can fill on or after September 08 2015  . amphetamine-dextroamphetamine (ADDERALL) 30 MG tablet    Sig: Take 1 tablet by mouth daily.    Dispense:  30 tablet    Refill:  0    May refiil  after or on October 08 2015  . amphetamine-dextroamphetamine (ADDERALL) 30 MG tablet    Sig: Take 1 tablet by mouth daily.    Dispense:  30 tablet    Refill:  0    Can fill on or after  November 08, 2015    Medications Discontinued During This Encounter  Medication Reason  . amphetamine-dextroamphetamine (ADDERALL) 30 MG tablet Reorder  . amphetamine-dextroamphetamine (ADDERALL) 30 MG tablet Reorder  . amphetamine-dextroamphetamine (ADDERALL) 30 MG tablet Reorder    Follow-up: No Follow-up on file.   Sherlene Shams, MD

## 2015-09-01 NOTE — Patient Instructions (Addendum)
I recommend trying 800 mg motrin plus 5 mg flexeril every 8 hour s AS NEEDED for jaw pain    Temporomandibular Joint Syndrome Temporomandibular joint (TMJ) syndrome is a condition that affects the joints between your jaw and your skull. The TMJs are located near your ears and allow your jaw to open and close. These joints and the nearby muscles are involved in all movements of the jaw. People with TMJ syndrome have pain in the area of these joints and muscles. Chewing, biting, or other movements of the jaw can be difficult or painful. TMJ syndrome can be caused by various things. In many cases, the condition is mild and goes away within a few weeks. For some people, the condition can become a long-term problem. CAUSES Possible causes of TMJ syndrome include:  Grinding your teeth or clenching your jaw. Some people do this when they are under stress.  Arthritis.  Injury to the jaw.  Head or neck injury.  Teeth or dentures that are not aligned well. In some cases, the cause of TMJ syndrome may not be known. SIGNS AND SYMPTOMS The most common symptom is an aching pain on the side of the head in the area of the TMJ. Other symptoms may include:  Pain when moving your jaw, such as when chewing or biting.  Being unable to open your jaw all the way.  Making a clicking sound when you open your mouth.  Headache.  Earache.  Neck or shoulder pain. DIAGNOSIS Diagnosis can usually be made based on your symptoms, your medical history, and a physical exam. Your health care provider may check the range of motion of your jaw. Imaging tests, such as X-rays or an MRI, are sometimes done. You may need to see your dentist to determine if your teeth and jaw are lined up correctly. TREATMENT TMJ syndrome often goes away on its own. If treatment is needed, the options may include:  Eating soft foods and applying ice or heat.  Medicines to relieve pain or inflammation.  Medicines to relax the  muscles.  A splint, bite plate, or mouthpiece to prevent teeth grinding or jaw clenching.  Relaxation techniques or counseling to help reduce stress.  Transcutaneous electrical nerve stimulation (TENS). This helps to relieve pain by applying an electrical current through the skin.  Acupuncture. This is sometimes helpful to relieve pain.  Jaw surgery. This is rarely needed. HOME CARE INSTRUCTIONS  Take medicines only as directed by your health care provider.  Eat a soft diet if you are having trouble chewing.  Apply ice to the painful area.  Put ice in a plastic bag.  Place a towel between your skin and the bag.  Leave the ice on for 20 minutes, 2-3 times a day.  Apply a warm compress to the painful area as directed.  Massage your jaw area and perform any jaw stretching exercises as recommended by your health care provider.  If you were given a mouthpiece or bite plate, wear it as directed.  Avoid foods that require a lot of chewing. Do not chew gum.  Keep all follow-up visits as directed by your health care provider. This is important. SEEK MEDICAL CARE IF:  You are having trouble eating.  You have new or worsening symptoms. SEEK IMMEDIATE MEDICAL CARE IF:  Your jaw locks open or closed.   This information is not intended to replace advice given to you by your health care provider. Make sure you discuss any questions you have with  your health care provider.   Document Released: 07/06/2001 Document Revised: 11/01/2014 Document Reviewed: 05/16/2014 Elsevier Interactive Patient Education [employer]2016 [employer] Inc.

## 2015-09-02 DIAGNOSIS — M26609 Unspecified temporomandibular joint disorder, unspecified side: Secondary | ICD-10-CM | POA: Insufficient documentation

## 2015-09-02 NOTE — Assessment & Plan Note (Signed)
She has decided to undergo gastric [facility] and evaluation is on going.  A total of 25 minutes of face to face time was spent with patient more than half of which was spent in counselling about the above mentioned condition

## 2015-09-02 NOTE — Assessment & Plan Note (Signed)
Advised to try motrin plus Muscle relaxer.  dietary adjustments advised. Handout given.

## 2015-09-02 NOTE — Assessment & Plan Note (Signed)
Er symptoms have been managed with daily Adderall 30 mg, and she i s tolerating the medication without Refills for 3 months given

## 2015-09-02 NOTE — Assessment & Plan Note (Signed)
Symptoms have been well Managed with wellbutrin.  No changes today

## 2015-10-07 ENCOUNTER — Other Ambulatory Visit: Payer: Self-pay | Admitting: Internal Medicine

## 2015-10-08 NOTE — Telephone Encounter (Signed)
refilled 

## 2015-10-08 NOTE — Telephone Encounter (Signed)
Please advise 

## 2015-10-23 LAB — LIPID PANEL
CHOLESTEROL: 170 mg/dL (ref 0–200)
HDL: 60 mg/dL (ref 35–70)
LDL Cholesterol: 98 mg/dL
Triglycerides: 63 mg/dL (ref 40–160)

## 2015-10-23 LAB — BASIC METABOLIC PANEL
BUN: 17 mg/dL (ref 4–21)
Creatinine: 0.7 mg/dL (ref 0.5–1.1)
GLUCOSE: 120 mg/dL
POTASSIUM: 3.4 mmol/L (ref 3.4–5.3)
SODIUM: 137 mmol/L (ref 137–147)

## 2015-10-23 LAB — HEMOGLOBIN A1C: HEMOGLOBIN A1C: 5.3

## 2015-10-23 LAB — CBC AND DIFFERENTIAL
HCT: 36 % (ref 36–46)
HEMOGLOBIN: 12.4 g/dL (ref 12.0–16.0)
Platelets: 309 10*3/uL (ref 150–399)

## 2015-11-07 ENCOUNTER — Other Ambulatory Visit: Payer: Self-pay | Admitting: Internal Medicine

## 2015-12-17 ENCOUNTER — Other Ambulatory Visit: Payer: Self-pay | Admitting: Internal Medicine

## 2015-12-19 DIAGNOSIS — L74510 Primary focal hyperhidrosis, axilla: Secondary | ICD-10-CM | POA: Insufficient documentation

## 2016-01-05 ENCOUNTER — Other Ambulatory Visit: Payer: Self-pay | Admitting: Internal Medicine

## 2016-01-06 ENCOUNTER — Other Ambulatory Visit: Payer: Self-pay | Admitting: *Deleted

## 2016-01-06 NOTE — Telephone Encounter (Signed)
Pt is requesting a refill on her adderall. Pt's last OV 09/01/15, last filled 09/01/15. Please advise, thanks

## 2016-01-06 NOTE — Telephone Encounter (Signed)
Patient requested a medication refill for adderall. Please call when ready for pick up

## 2016-01-07 MED ORDER — AMPHETAMINE-DEXTROAMPHETAMINE 30 MG PO TABS: 30.0000 mg | ORAL_TABLET | Freq: Every day | ORAL | Status: DC

## 2016-01-07 NOTE — Telephone Encounter (Signed)
Notified pt. 

## 2016-01-07 NOTE — Telephone Encounter (Signed)
REFILLED FOR 3 MONTHS.   OFFICE VISIT NEEDED prior to any more refills

## 2016-03-08 ENCOUNTER — Ambulatory Visit (INDEPENDENT_AMBULATORY_CARE_PROVIDER_SITE_OTHER): Payer: BLUE CROSS/BLUE SHIELD | Admitting: Family Medicine

## 2016-03-08 ENCOUNTER — Encounter: Payer: Self-pay | Admitting: Family Medicine

## 2016-03-08 DIAGNOSIS — R1024 Suprapubic pain: Secondary | ICD-10-CM | POA: Insufficient documentation

## 2016-03-08 DIAGNOSIS — R1030 Lower abdominal pain, unspecified: Secondary | ICD-10-CM

## 2016-03-08 DIAGNOSIS — R102 Pelvic and perineal pain: Secondary | ICD-10-CM | POA: Insufficient documentation

## 2016-03-08 LAB — POCT URINALYSIS DIPSTICK
BILIRUBIN UA: NEGATIVE
GLUCOSE UA: NEGATIVE
KETONES UA: NEGATIVE
Leukocytes, UA: NEGATIVE
Nitrite, UA: NEGATIVE
Protein, UA: 15
RBC UA: NEGATIVE
Urobilinogen, UA: NEGATIVE
pH, UA: 6

## 2016-03-08 MED ORDER — HYDROCODONE-ACETAMINOPHEN 5-325 MG PO TABS: 1.0000 | ORAL_TABLET | Freq: Four times a day (QID) | ORAL | Status: DC | PRN

## 2016-03-08 MED ORDER — TAMSULOSIN HCL 0.4 MG PO CAPS: 0.4000 mg | ORAL_CAPSULE | Freq: Every day | ORAL | Status: DC

## 2016-03-08 NOTE — Progress Notes (Signed)
Pre visit review using our clinic review tool, if applicable. No additional management support is needed unless otherwise documented below in the visit note. 

## 2016-03-08 NOTE — Patient Instructions (Signed)
I doubt this is a stone but I have given you the medications just in case.  Follow up closely.  Take care  Dr. Adriana Simasook

## 2016-03-08 NOTE — Assessment & Plan Note (Signed)
New problem. UA negative. History not consistent with nephrolithiasis. Sending for culture. Patient requested medication in case she develops worsening pain or additional kidney stone. Rx given for Vicodin and Flomax.

## 2016-03-08 NOTE — Progress Notes (Signed)
Subjective:  Patient ID: Candice Vaughn, female    DOB: 1971-03-03  Age: 45 y.o. MRN: [id number]  CC: Concern for Vaughn stone  HPI:  45 year old female presents with concern she has a Vaughn stone.  Patient states that she developed suprapubic pain on Saturday. She states the pain is been intermittent and mild in severity (3/10). She states that she also has some urethral discomfort. She denies any flank pain. No fevers or chills. No gross hematuria. No known exacerbating or relieving factors. She is determined that she has a Vaughn stone as she has had in the past. She would like to discuss this today.  Social Hx   Social History   Social History  . Marital Status: Married    Spouse Name: N/A  . Number of Children: N/A  . Years of Education: N/A   Social History Main Topics  . Smoking status: Never Smoker   . Smokeless tobacco: Never Used  . Alcohol Use: Yes     Comment: OCCASIONAL  . Drug Use: No  . Sexual Activity: Not Asked   Other Topics Concern  . None   Social History Narrative   Review of Systems  Constitutional: Negative.   Gastrointestinal:       Suprapubic pain.  Genitourinary: Negative for frequency and flank pain.       "urethral pain".   Objective:  BP 110/76 mmHg  Pulse 77  Temp(Src) 98.2 F (36.8 C) (Oral)  Ht 5\' 5"  (1.651 m)  Wt 197 lb 6 oz (89.529 kg)  BMI 32.85 kg/m2  SpO2 97%  BP/Weight 03/08/2016 09/01/2015 01/31/2015  Systolic BP 110 110 122  Diastolic BP 76 70 80  Wt. (Lbs) 197.38 217.2 218  BMI 32.85 36.14 35.71   Physical Exam  Constitutional: She is oriented to person, place, and time. She appears well-developed. No distress.  Cardiovascular: Normal rate and regular rhythm.   Pulmonary/Chest: Effort normal and breath sounds normal.  Abdominal: Soft. She exhibits no distension. There is no tenderness. There is no rebound and no guarding.  No CVA tenderness.  Neurological: She is alert and oriented to person, place, and time.    Vitals reviewed.  Lab Results  Component Value Date   WBC 9.0 10/04/2014   HGB 13.5 10/04/2014   HCT 40.6 10/04/2014   PLT 282.0 10/04/2014   GLUCOSE 92 10/04/2014   CHOL 159 10/04/2014   TRIG 68.0 10/04/2014   HDL 64.70 10/04/2014   LDLCALC 81 10/04/2014   ALT 27 10/04/2014   AST 19 10/04/2014   NA 137 10/04/2014   K 4.5 10/04/2014   CL 107 10/04/2014   CREATININE 0.8 10/04/2014   BUN 21 10/04/2014   CO2 24 10/04/2014   TSH 1.91 10/04/2014   Results for orders placed or performed in visit on 03/08/16 (from the past 24 hour(s))  POCT Urinalysis Dipstick     Status: Normal   Collection Time: 03/08/16  9:02 AM  Result Value Ref Range   Color, UA dark yellow    Clarity, UA clear    Glucose, UA neg    Bilirubin, UA neg    Ketones, UA neg    Spec Grav, UA >=1.030    Blood, UA neg    pH, UA 6.0    Protein, UA 15 mg/dL    Urobilinogen, UA negative    Nitrite, UA neg    Leukocytes, UA Negative Negative   Assessment & Plan:   Problem List Items Addressed This Visit  Suprapubic pain    New problem. UA negative. History not consistent with nephrolithiasis. Sending for culture. Patient requested medication in case she develops worsening pain or additional Vaughn stone. Rx given for Vicodin and Flomax.      Relevant Medications   tamsulosin (FLOMAX) 0.4 MG CAPS capsule   HYDROcodone-acetaminophen (NORCO/VICODIN) 5-325 MG tablet   Other Relevant Orders   POCT Urinalysis Dipstick (Completed)   Urine Culture      Meds ordered this encounter  Medications  . tamsulosin (FLOMAX) 0.4 MG CAPS capsule    Sig: Take 1 capsule (0.4 mg total) by mouth daily.    Dispense:  30 capsule    Refill:  3  . HYDROcodone-acetaminophen (NORCO/VICODIN) 5-325 MG tablet    Sig: Take 1 tablet by mouth every 6 (six) hours as needed for moderate pain.    Dispense:  30 tablet    Refill:  0   Follow-up: PRN  Everlene Other DO [facility]

## 2016-03-10 LAB — URINE CULTURE: Colony Count: 4000

## 2016-05-03 ENCOUNTER — Other Ambulatory Visit: Payer: Self-pay

## 2016-05-03 DIAGNOSIS — R1030 Lower abdominal pain, unspecified: Secondary | ICD-10-CM

## 2016-05-03 MED ORDER — TAMSULOSIN HCL 0.4 MG PO CAPS: 0.4000 mg | ORAL_CAPSULE | Freq: Every day | ORAL | Status: DC

## 2016-05-18 DIAGNOSIS — K912 Postsurgical malabsorption, not elsewhere classified: Secondary | ICD-10-CM | POA: Insufficient documentation

## 2016-05-19 ENCOUNTER — Telehealth: Payer: Self-pay | Admitting: Internal Medicine

## 2016-05-31 ENCOUNTER — Ambulatory Visit: Payer: BLUE CROSS/BLUE SHIELD | Admitting: Internal Medicine

## 2016-06-21 ENCOUNTER — Ambulatory Visit (INDEPENDENT_AMBULATORY_CARE_PROVIDER_SITE_OTHER): Payer: BLUE CROSS/BLUE SHIELD | Admitting: Internal Medicine

## 2016-06-21 ENCOUNTER — Encounter: Payer: Self-pay | Admitting: Internal Medicine

## 2016-06-21 VITALS — BP 118/80 | HR 80 | Temp 98.1°F | Resp 12 | Ht 65.0 in | Wt 193.5 lb

## 2016-06-21 DIAGNOSIS — F909 Attention-deficit hyperactivity disorder, unspecified type: Secondary | ICD-10-CM

## 2016-06-21 DIAGNOSIS — Z9884 Bariatric surgery status: Secondary | ICD-10-CM | POA: Insufficient documentation

## 2016-06-21 DIAGNOSIS — E559 Vitamin D deficiency, unspecified: Secondary | ICD-10-CM | POA: Diagnosis not present

## 2016-06-21 DIAGNOSIS — E669 Obesity, unspecified: Secondary | ICD-10-CM

## 2016-06-21 DIAGNOSIS — F900 Attention-deficit hyperactivity disorder, predominantly inattentive type: Secondary | ICD-10-CM | POA: Diagnosis not present

## 2016-06-21 DIAGNOSIS — R2232 Localized swelling, mass and lump, left upper limb: Secondary | ICD-10-CM | POA: Diagnosis not present

## 2016-06-21 MED ORDER — ALPRAZOLAM 0.5 MG PO TABS: 0.5000 mg | ORAL_TABLET | Freq: Every evening | ORAL | 3 refills | Status: DC | PRN

## 2016-06-21 MED ORDER — AMPHETAMINE-DEXTROAMPHETAMINE 30 MG PO TABS: 30.0000 mg | ORAL_TABLET | Freq: Every day | ORAL | 0 refills | Status: DC

## 2016-06-21 NOTE — Patient Instructions (Signed)
Diagnostic mammogram ordered.  

## 2016-06-21 NOTE — Progress Notes (Signed)
Subjective:  Patient ID: Candice Vaughn, female    DOB: 09-30-1971  Age: 45 y.o. MRN: [id number]  CC: The primary encounter diagnosis was Lump of axilla, left. Diagnoses of Vitamin D deficiency, S/P bariatric surgery, Attention deficit disorder of adult with hyperactivity, and Obesity (BMI 30-39.9) were also pertinent to this visit.  HPI Candice Vaughn presents for follow up on multiple issues :  1) Obesity: s/p gastric sleeve surgery in Decemeber 2016. She has No appetite  Current meals sizes are about 25% of former portion size,  Eats more frequently  Has had a 30 lb wt loss. Taking Vitamin D,  Calcium chews,    2) Mildly tender swelling in left axilla first noticed in February.  . Has a history of lipoma removed at [facility] 18 years ago.  No history of redness,. Overdue on mammogram .  plt [phone]  3) ADD:  Tolerating meds without increased insomnia or agitation.  Performing well at work.   Outpatient Medications Prior to Visit  Medication Sig Dispense Refill  . buPROPion (WELLBUTRIN XL) 300 MG 24 hr tablet TAKE 1 TABLET (300 MG TOTAL) BY MOUTH DAILY. 90 tablet 1  . Cholecalciferol (VITAMIN D3) 2000 UNITS capsule Take 1 capsule (2,000 Units total) by mouth daily. 90 capsule 0  . cyclobenzaprine (FLEXERIL) 5 MG tablet Take 1 tablet (5 mg total) by mouth 3 (three) times daily as needed for muscle spasms. 90 tablet 1  . metFORMIN (GLUCOPHAGE) 500 MG tablet TAKE 1 TABLET (500 MG TOTAL) BY MOUTH DAILY WITH BREAKFAST. 90 tablet 2  . metroNIDAZOLE (METROGEL) 1 % gel Apply 1 application topically daily. 45 g 1  . rizatriptan (MAXALT) 10 MG tablet TAKE 1 TABLET BY MOUTH AS NEEDED MAY REPEAT IN 2 HOURS IF NEEDED 10 tablet 2  . spironolactone (ALDACTONE) 25 MG tablet TAKE 1 TABLET (25 MG TOTAL) BY MOUTH DAILY. 90 tablet 3  . ALPRAZolam (XANAX) 0.5 MG tablet TAKE ONE TABLET BY MOUTH AT BEDTIME AS NEEDED 30 tablet 3  . amphetamine-dextroamphetamine (ADDERALL) 30 MG tablet Take 1 tablet by mouth daily. 30  tablet 0  . amphetamine-dextroamphetamine (ADDERALL) 30 MG tablet Take 1 tablet by mouth daily. 30 tablet 0  . amphetamine-dextroamphetamine (ADDERALL) 30 MG tablet Take 1 tablet by mouth daily. 30 tablet 0  . HYDROcodone-acetaminophen (NORCO/VICODIN) 5-325 MG per tablet Take 1 tablet by mouth every 6 (six) hours as needed for pain. 30 tablet 0  . HYDROcodone-acetaminophen (NORCO/VICODIN) 5-325 MG per tablet Take 1 tablet by mouth every 6 (six) hours as needed for moderate pain. 30 tablet 0  . HYDROcodone-acetaminophen (NORCO/VICODIN) 5-325 MG tablet Take 1 tablet by mouth every 6 (six) hours as needed for moderate pain. (Patient not taking: Reported on 06/21/2016) 30 tablet 0  . tamsulosin (FLOMAX) 0.4 MG CAPS capsule Take 1 capsule (0.4 mg total) by mouth daily. (Patient not taking: Reported on 06/21/2016) 90 capsule 1   No facility-administered medications prior to visit.     Review of Systems;  Patient denies headache, fevers, malaise, unintentional weight loss, skin rash, eye pain, sinus congestion and sinus pain, sore throat, dysphagia,  hemoptysis , cough, dyspnea, wheezing, chest pain, palpitations, orthopnea, edema, abdominal pain, nausea, melena, diarrhea, constipation, flank pain, dysuria, hematuria, urinary  Frequency, nocturia, numbness, tingling, seizures,  Focal weakness, Loss of consciousness,  Tremor, insomnia, depression, anxiety, and suicidal ideation.      Objective:  BP 118/80   Pulse 80   Temp 98.1 F (36.7 C) (Oral)   Resp  12   Ht 5\' 5"  (1.651 m)   Wt 193 lb 8 oz (87.8 kg)   SpO2 97%   BMI 32.20 kg/m   BP Readings from Last 3 Encounters:  06/21/16 118/80  03/08/16 110/76  09/01/15 110/70    Wt Readings from Last 3 Encounters:  06/21/16 193 lb 8 oz (87.8 kg)  03/08/16 197 lb 6 oz (89.5 kg)  09/01/15 217 lb 3.2 oz (98.5 kg)    General appearance: alert, cooperative and appears stated age Ears: normal TM's and external ear canals both ears Throat: lips,  mucosa, and tongue normal; teeth and gums normal Neck: no adenopathy, no carotid bruit, supple, symmetrical, trachea midline and thyroid not enlarged, symmetric, no tenderness/mass/nodules Back: symmetric, no curvature. ROM normal. No CVA tenderness. Lungs: clear to auscultation bilaterally Heart: regular rate and rhythm, S1, S2 normal, no murmur, click, rub or gallop Abdomen: soft, non-tender; bowel sounds normal; no masses,  no organomegaly Pulses: 2+ and symmetric Skin: Skin color, texture, turgor normal. No rashes or lesions Lymph nodes: Cervical, supraclavicular, and axillary nodes normal.  Lab Results  Component Value Date   HGBA1C 5.3 10/23/2015    Lab Results  Component Value Date   CREATININE 0.7 10/23/2015   CREATININE 0.8 10/04/2014   CREATININE 0.71 09/16/2011    Lab Results  Component Value Date   WBC 9.0 10/04/2014   HGB 12.4 10/23/2015   HCT 36 10/23/2015   PLT 309 10/23/2015   GLUCOSE 92 10/04/2014   CHOL 170 10/23/2015   TRIG 63 10/23/2015   HDL 60 10/23/2015   LDLCALC 98 10/23/2015   ALT 27 10/04/2014   AST 19 10/04/2014   NA 137 10/23/2015   K 3.4 10/23/2015   CL 107 10/04/2014   CREATININE 0.7 10/23/2015   BUN 17 10/23/2015   CO2 24 10/04/2014   TSH 1.91 10/04/2014   HGBA1C 5.3 10/23/2015    No results found.  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  Lab Results  Component Value Date   ALT 27 10/04/2014   AST 19 10/04/2014   ALKPHOS 28 (L) 10/04/2014   BILITOT 0.5 10/04/2014         Relevant Medications   amphetamine-dextroamphetamine (ADDERALL) 30 MG tablet    amphetamine-dextroamphetamine (ADDERALL) 30 MG tablet   amphetamine-dextroamphetamine (ADDERALL) 30 MG tablet   Vitamin D deficiency    She is taking a supplement.       S/P bariatric surgery    Gastric sleeve Dec 2016.  30 lb weight loss thus far.      Lump of axilla - Primary    She has a history of lipoma removal many years ago,  But she is overdue for mammogram.   diagnostic mammogram ordered.       Relevant Orders   MM Digital Diagnostic Bilat    Other Visit Diagnoses   None.     I have discontinued Ms. Keitt's HYDROcodone-acetaminophen, HYDROcodone-acetaminophen, HYDROcodone-acetaminophen, and tamsulosin. I have also changed her ALPRAZolam. Additionally, I am having her maintain her metroNIDAZOLE, rizatriptan, Vitamin D3, cyclobenzaprine, buPROPion, spironolactone, metFORMIN, amphetamine-dextroamphetamine,  amphetamine-dextroamphetamine, and amphetamine-dextroamphetamine.  Meds ordered this encounter  Medications  . ALPRAZolam (XANAX) 0.5 MG tablet    Sig: Take 1 tablet (0.5 mg total) by mouth at bedtime as needed.    Dispense:  30 tablet    Refill:  3    Not to exceed 4 additional fills before 01/24/2016  . amphetamine-dextroamphetamine (ADDERALL) 30 MG tablet    Sig: Take 1 tablet by mouth daily.    Dispense:  30 tablet    Refill:  0  . amphetamine-dextroamphetamine (ADDERALL) 30 MG tablet    Sig: Take 1 tablet by mouth daily.    Dispense:  30 tablet    Refill:  0    May refiil after or on  August 21, 2016  . amphetamine-dextroamphetamine (ADDERALL) 30 MG tablet    Sig: Take 1 tablet by mouth daily.    Dispense:  30 tablet    Refill:  0    Can fill on or after  July 22, 2016.    Medications Discontinued During This Encounter  Medication Reason  . HYDROcodone-acetaminophen (NORCO/VICODIN) 5-325 MG per tablet Completed Course  . HYDROcodone-acetaminophen (NORCO/VICODIN) 5-325 MG per tablet Duplicate  . ALPRAZolam (XANAX) 0.5 MG tablet Reorder  .  amphetamine-dextroamphetamine (ADDERALL) 30 MG tablet Reorder  . amphetamine-dextroamphetamine (ADDERALL) 30 MG tablet Reorder  . amphetamine-dextroamphetamine (ADDERALL) 30 MG tablet Reorder  . tamsulosin (FLOMAX) 0.4 MG CAPS capsule   . HYDROcodone-acetaminophen (NORCO/VICODIN) 5-325 MG tablet    A total of 25 minutes of face to face time was spent with patient more than half of which was spent in counselling about the above mentioned conditions  and coordination of care  Follow-up: No Follow-up on file.   Sherlene Shams, MD

## 2016-06-21 NOTE — Progress Notes (Signed)
Pre-visit discussion using our clinic review tool. No additional management support is needed unless otherwise documented below in the visit note.  

## 2016-06-22 DIAGNOSIS — R2230 Localized swelling, mass and lump, unspecified upper limb: Secondary | ICD-10-CM | POA: Insufficient documentation

## 2016-06-22 NOTE — Assessment & Plan Note (Signed)
She is taking a supplement.

## 2016-06-22 NOTE — Assessment & Plan Note (Addendum)
She has a history of lipoma removal many years ago,  But she is overdue for mammogram.   diagnostic mammogram ordered.

## 2016-06-22 NOTE — Assessment & Plan Note (Signed)
Gastric sleeve Dec 2016.  30 lb weight loss thus far.

## 2016-06-22 NOTE — Assessment & Plan Note (Addendum)
Her symptoms have been managed with daily Adderall 30 mg, and she is tolerating the medication without Refills for 3 months given

## 2016-06-22 NOTE — Assessment & Plan Note (Signed)
She has lost 30 lbs since undergoing bariatric surgery with gastric sleeve in December 2016.  Lab Results  Component Value Date   ALT 27 10/04/2014   AST 19 10/04/2014   ALKPHOS 28 (L) 10/04/2014   BILITOT 0.5 10/04/2014

## 2016-06-23 ENCOUNTER — Ambulatory Visit: Payer: BLUE CROSS/BLUE SHIELD | Admitting: Internal Medicine

## 2016-08-24 ENCOUNTER — Encounter: Payer: Self-pay | Admitting: Internal Medicine

## 2016-09-30 ENCOUNTER — Encounter: Payer: Self-pay | Admitting: Internal Medicine

## 2016-10-15 ENCOUNTER — Other Ambulatory Visit: Payer: Self-pay | Admitting: Internal Medicine

## 2016-11-12 ENCOUNTER — Other Ambulatory Visit: Payer: Self-pay | Admitting: Internal Medicine

## 2016-12-09 ENCOUNTER — Other Ambulatory Visit: Payer: Self-pay | Admitting: Internal Medicine

## 2016-12-22 ENCOUNTER — Ambulatory Visit (INDEPENDENT_AMBULATORY_CARE_PROVIDER_SITE_OTHER): Payer: BLUE CROSS/BLUE SHIELD | Admitting: Internal Medicine

## 2016-12-22 ENCOUNTER — Encounter: Payer: Self-pay | Admitting: Internal Medicine

## 2016-12-22 VITALS — BP 118/80 | HR 80 | Resp 16 | Wt 194.0 lb

## 2016-12-22 DIAGNOSIS — F909 Attention-deficit hyperactivity disorder, unspecified type: Secondary | ICD-10-CM | POA: Diagnosis not present

## 2016-12-22 DIAGNOSIS — R7301 Impaired fasting glucose: Secondary | ICD-10-CM

## 2016-12-22 DIAGNOSIS — E669 Obesity, unspecified: Secondary | ICD-10-CM

## 2016-12-22 DIAGNOSIS — K602 Anal fissure, unspecified: Secondary | ICD-10-CM

## 2016-12-22 LAB — POCT GLYCOSYLATED HEMOGLOBIN (HGB A1C): HEMOGLOBIN A1C: 5.1

## 2016-12-22 MED ORDER — AMPHETAMINE-DEXTROAMPHETAMINE 30 MG PO TABS
30.0000 mg | ORAL_TABLET | Freq: Every day | ORAL | 0 refills | Status: DC
Start: 2016-12-22 — End: 2017-06-21

## 2016-12-22 MED ORDER — AMPHETAMINE-DEXTROAMPHETAMINE 30 MG PO TABS
30.0000 mg | ORAL_TABLET | Freq: Every day | ORAL | 0 refills | Status: DC
Start: 2016-12-22 — End: 2017-04-07

## 2016-12-22 MED ORDER — HYDROCODONE-ACETAMINOPHEN 5-325 MG PO TABS: 1.0000 | ORAL_TABLET | Freq: Four times a day (QID) | ORAL | 0 refills | Status: DC | PRN

## 2016-12-22 MED ORDER — BENZONATATE 200 MG PO CAPS: 200.0000 mg | ORAL_CAPSULE | Freq: Three times a day (TID) | ORAL | 3 refills | Status: DC | PRN

## 2016-12-22 MED ORDER — AMPHETAMINE-DEXTROAMPHETAMINE 30 MG PO TABS: 30.0000 mg | ORAL_TABLET | Freq: Every day | ORAL | 0 refills | Status: DC

## 2016-12-22 MED ORDER — DILTIAZEM GEL 2 %: CUTANEOUS | 3 refills | Status: DC

## 2016-12-22 NOTE — Progress Notes (Signed)
Pre visit review using our clinic review tool, if applicable. No additional management support is needed unless otherwise documented below in the visit note. 

## 2016-12-22 NOTE — Progress Notes (Signed)
Subjective:  Patient ID: Candice Vaughn, female    DOB: 21-Feb-1971  Age: 46 y.o. 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  HPI Candice Bolizabeth Escudero presents for follow up  On on ADD  sympotms have been well controlled on 30 mg adderall.  Dose has been stable for over a year.  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 refill requested  She has not had any ER visits  And has not requested any early refills.  Her Refill history was confirmed via [place] Controlled Substance database by me today during her visit and there have been no prescriptions of controlled substances filled from any providers other than me. .   Outpatient Medications Prior to Visit  Medication Sig Dispense Refill  . ALPRAZolam (XANAX) 0.5 MG tablet Take 1 tablet (0.5 mg total) by mouth at bedtime as needed. 30 tablet 3  . buPROPion (WELLBUTRIN XL) 300 MG 24 hr tablet TAKE 1 TABLET (300 MG TOTAL) BY MOUTH DAILY. 90 tablet 1  . Cholecalciferol (VITAMIN D3) 2000 UNITS capsule Take 1 capsule (2,000 Units total) by mouth daily. 90 capsule 0  . cyclobenzaprine (FLEXERIL) 5 MG tablet Take 1 tablet (5 mg total) by mouth 3 (three) times daily as needed for muscle spasms. 90 tablet 1  . metroNIDAZOLE (METROGEL) 1 % gel Apply 1 application topically daily. 45 g 1  . rizatriptan (MAXALT) 10 MG tablet TAKE 1 TABLET BY MOUTH AS NEEDED MAY REPEAT IN 2 HOURS IF NEEDED 10 tablet 2  . amphetamine-dextroamphetamine (ADDERALL) 30 MG tablet Take 1 tablet by mouth daily. 30 tablet 0  .  amphetamine-dextroamphetamine (ADDERALL) 30 MG tablet Take 1 tablet by mouth daily. 30 tablet 0  . amphetamine-dextroamphetamine (ADDERALL) 30 MG tablet Take 1 tablet by mouth daily. 30 tablet 0  . metFORMIN (GLUCOPHAGE) 500 MG tablet TAKE 1 TABLET (500 MG TOTAL) BY MOUTH DAILY WITH BREAKFAST. (Patient not taking: Reported on 12/22/2016) 90 tablet 1  . spironolactone (ALDACTONE) 25 MG tablet TAKE 1 TABLET (25 MG TOTAL) BY MOUTH DAILY. (Patient not taking: Reported on 12/22/2016) 90 tablet 3   No facility-administered medications prior to visit.     Review of Systems;  Patient denies headache, fevers, malaise, unintentional weight loss, skin rash, eye pain, sinus congestion and sinus pain, sore throat, dysphagia,  hemoptysis , cough, dyspnea, wheezing, chest pain, palpitations, orthopnea, edema, abdominal pain, nausea, melena, diarrhea, constipation, flank pain, dysuria, hematuria, urinary  Frequency, nocturia, numbness, tingling, seizures,  Focal weakness, Loss of consciousness,  Tremor, insomnia, depression, anxiety, and suicidal ideation.      Objective:  BP 118/80   Pulse 80   Resp 16   Wt 194 lb (88 kg)   SpO2 96%   BMI 32.28 kg/m   BP Readings from Last 3 Encounters:  12/22/16 118/80  06/21/16 118/80  03/08/16 110/76    Wt Readings from Last 3 Encounters:  12/22/16 194 lb (88 kg)  06/21/16 193 lb 8 oz (87.8 kg)  03/08/16 197 lb 6 oz (89.5 kg)    General appearance: alert, cooperative and  appears stated age Ears: normal TM's and external ear canals both ears Throat: lips, mucosa, and tongue normal; teeth and gums normal Neck: no adenopathy, no carotid bruit, supple, symmetrical, trachea midline and thyroid not enlarged, symmetric, no tenderness/mass/nodules Back: symmetric, no curvature. ROM normal. No CVA tenderness. Lungs: clear to auscultation bilaterally Heart: regular rate and rhythm, S1, S2 normal, no murmur, click, rub or gallop Abdomen: soft, non-tender; bowel  sounds normal; no masses,  no organomegaly Pulses: 2+ and symmetric Skin: Skin color, texture, turgor normal. No rashes or lesions Lymph nodes: Cervical, supraclavicular, and axillary nodes normal.  Lab Results  Component Value Date   HGBA1C 5.1 12/22/2016   HGBA1C 5.3 10/23/2015    Lab Results  Component Value Date   CREATININE 0.7 10/23/2015   CREATININE 0.8 10/04/2014   CREATININE 0.71 09/16/2011    Lab Results  Component Value Date   WBC 9.0 10/04/2014   HGB 12.4 10/23/2015   HCT 36 10/23/2015   PLT 309 10/23/2015   GLUCOSE 92 10/04/2014   CHOL 170 10/23/2015   TRIG 63 10/23/2015   HDL 60 10/23/2015   LDLCALC 98 10/23/2015   ALT 27 10/04/2014   AST 19 10/04/2014   NA 137 10/23/2015   K 3.4 10/23/2015   CL 107 10/04/2014   CREATININE 0.7 10/23/2015   BUN 17 10/23/2015   CO2 24 10/04/2014   TSH 1.91 10/04/2014   HGBA1C 5.1 12/22/2016    No results found.  Assessment & Plan:   Problem List Items Addressed This Visit    Anal fissure - Primary    Trial of diltiazem gel.       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 Refill history confirmed via [place] Controlled Substance databas, accessed by me today..            Impaired fasting glucose    a1c is normal.  Lab Results  Component Value Date   HGBA1C 5.1 12/22/2016         Relevant Orders   POCT glycosylated hemoglobin (Hb A1C) (Completed)   Obesity (BMI 30-39.9)    She has lost 26  lbs since undergoing bariatric surgery with gastric sleeve in December 2016 but her weight loss has plateau ed due to work /study schedule. . labs are done at [facility]         Relevant Medications   amphetamine-dextroamphetamine (ADDERALL) 30 MG tablet   amphetamine-dextroamphetamine (ADDERALL) 30 MG tablet   amphetamine-dextroamphetamine (ADDERALL) 30 MG tablet      I  have changed Ms. Francisco's HYDROcodone-acetaminophen. I am also having her start on diltiazem and benzonatate. Additionally, I am having her maintain her metroNIDAZOLE, rizatriptan, Vitamin D3, cyclobenzaprine, ALPRAZolam, metFORMIN, buPROPion, spironolactone, amphetamine-dextroamphetamine, amphetamine-dextroamphetamine, and amphetamine-dextroamphetamine.  Meds ordered this encounter  Medications  . HYDROcodone-acetaminophen (NORCO/VICODIN) 5-325 MG tablet    Sig: Take 1 tablet by mouth every 6 (six) hours as needed.    Dispense:  30 tablet    Refill:  0  . diltiazem 2 % GEL    Sig: APPLY RECTALLY 3 TIMES DAILY AS NEEDED FOR RECTAL PAIN    Dispense:  30 g    Refill:  3  . amphetamine-dextroamphetamine (ADDERALL) 30 MG tablet    Sig: Take 1 tablet by mouth daily.    Dispense:  30 tablet    Refill:  0  May fill on or after February 19 2017  . amphetamine-dextroamphetamine (ADDERALL) 30 MG tablet    Sig: Take 1 tablet by mouth daily.    Dispense:  30 tablet    Refill:  0    May refiil after or on  January 19, 2017  . amphetamine-dextroamphetamine (ADDERALL) 30 MG tablet    Sig: Take 1 tablet by mouth daily.    Dispense:  30 tablet    Refill:  0    Can fill on or after December 22, 2016.  . benzonatate (TESSALON) 200 MG capsule    Sig: Take 1 capsule (200 mg total) by mouth 3 (three) times daily as needed for cough.    Dispense:  60 capsule    Refill:  3    Medications Discontinued During This Encounter  Medication Reason  . amphetamine-dextroamphetamine (ADDERALL) 30 MG tablet Reorder  . amphetamine-dextroamphetamine (ADDERALL) 30 MG tablet Reorder  . amphetamine-dextroamphetamine (ADDERALL) 30 MG tablet Reorder    Follow-up: Return in about 6 months (around 06/21/2017).   Sherlene Shams, MD

## 2016-12-22 NOTE — Assessment & Plan Note (Addendum)
Her symptoms have been managed with daily Adderall 30 mg, and she is tolerating the medication without Refills for 3 months given. Refill history confirmed via [place] Controlled Substance databas, accessed by me today..Marland Kitchen

## 2016-12-22 NOTE — Patient Instructions (Addendum)
I called in diltiazem gel 2% which you can use rectally every 8 hours during your next flare of pain  from hemorrhoids/fissures  I have refilled your adderall for 3 months

## 2016-12-23 DIAGNOSIS — R7301 Impaired fasting glucose: Secondary | ICD-10-CM | POA: Insufficient documentation

## 2016-12-23 NOTE — Assessment & Plan Note (Signed)
She has lost 26  lbs since undergoing bariatric surgery with gastric sleeve in December 2016 but her weight loss has plateau ed due to work /study schedule. . [facility]

## 2016-12-23 NOTE — Assessment & Plan Note (Signed)
a1c is normal.  Lab Results  Component Value Date   HGBA1C 5.1 12/22/2016

## 2016-12-23 NOTE — Assessment & Plan Note (Signed)
Trial of diltiazem gel.

## 2017-01-16 ENCOUNTER — Other Ambulatory Visit: Payer: Self-pay | Admitting: Internal Medicine

## 2017-01-17 NOTE — Telephone Encounter (Signed)
Refilled 06/21/2016 Last OV: 12/22/2016 Next OV: 06/21/2017

## 2017-01-17 NOTE — Telephone Encounter (Signed)
Refilled nad printed

## 2017-01-18 ENCOUNTER — Encounter: Payer: Self-pay | Admitting: Internal Medicine

## 2017-01-18 NOTE — Telephone Encounter (Signed)
Signed and faxed

## 2017-04-07 ENCOUNTER — Telehealth: Payer: Self-pay | Admitting: *Deleted

## 2017-04-07 NOTE — Telephone Encounter (Signed)
Patient requested a medication refill for Adderall  Pt contact [phone]

## 2017-04-07 NOTE — Telephone Encounter (Signed)
Refilled: 12/22/2016 with 2 refills Last OV: 12/22/2016 Next OV: 06/21/2017

## 2017-04-08 MED ORDER — AMPHETAMINE-DEXTROAMPHETAMINE 30 MG PO TABS: 30.0000 mg | ORAL_TABLET | Freq: Every day | ORAL | 0 refills | Status: DC

## 2017-04-08 NOTE — Telephone Encounter (Signed)
Ok to refill but will have to wait for signature until monday

## 2017-04-11 NOTE — Telephone Encounter (Signed)
Pt called back and was informed that her Rx is ready for pick up. Thank you!

## 2017-04-11 NOTE — Telephone Encounter (Signed)
LMTCB. Need to let pt know that rx has been placed up front for pick up.

## 2017-04-12 ENCOUNTER — Other Ambulatory Visit: Payer: Self-pay | Admitting: Internal Medicine

## 2017-06-21 ENCOUNTER — Encounter: Payer: Self-pay | Admitting: Internal Medicine

## 2017-06-21 ENCOUNTER — Ambulatory Visit (INDEPENDENT_AMBULATORY_CARE_PROVIDER_SITE_OTHER): Payer: BLUE CROSS/BLUE SHIELD | Admitting: Internal Medicine

## 2017-06-21 VITALS — BP 104/68 | HR 81 | Temp 98.3°F | Resp 16 | Ht 65.0 in | Wt 206.2 lb

## 2017-06-21 DIAGNOSIS — E669 Obesity, unspecified: Secondary | ICD-10-CM | POA: Diagnosis not present

## 2017-06-21 DIAGNOSIS — L74519 Primary focal hyperhidrosis, unspecified: Secondary | ICD-10-CM

## 2017-06-21 DIAGNOSIS — E785 Hyperlipidemia, unspecified: Secondary | ICD-10-CM

## 2017-06-21 DIAGNOSIS — F909 Attention-deficit hyperactivity disorder, unspecified type: Secondary | ICD-10-CM | POA: Diagnosis not present

## 2017-06-21 DIAGNOSIS — R61 Generalized hyperhidrosis: Secondary | ICD-10-CM

## 2017-06-21 DIAGNOSIS — M26609 Unspecified temporomandibular joint disorder, unspecified side: Secondary | ICD-10-CM

## 2017-06-21 MED ORDER — AMPHETAMINE-DEXTROAMPHET ER 15 MG PO CP24: 15.0000 mg | ORAL_CAPSULE | Freq: Every morning | ORAL | 0 refills | Status: DC

## 2017-06-21 NOTE — Assessment & Plan Note (Signed)
Managed now with botox injections every 90 days [facility] [place]  [facility]

## 2017-06-21 NOTE — Patient Instructions (Signed)
We are trying Adderal XR starting at the 15 mg dose

## 2017-06-21 NOTE — Assessment & Plan Note (Signed)
Aggravated by use of immediate release amphetamines Scheduled Meds: Continuous Infusions: PRN Meds:. Stopped

## 2017-06-21 NOTE — Assessment & Plan Note (Signed)
Trial of Adderall XR

## 2017-06-21 NOTE — Progress Notes (Signed)
Subjective:  Patient ID: Candice Vaughn, female    DOB: 1971/02/12  Age: 46 y.o. MRN: [id number]  CC: The primary encounter diagnosis was Hyperlipidemia LDL goal <130. Diagnoses of TMJ (temporomandibular joint syndrome), Attention deficit disorder of adult with hyperactivity, Hyperhidrosis, and Obesity (BMI 30-39.9) were also pertinent to this visit.  HPI Candice Vaughn presents for  Follow up on ADD   started have increased headaches and jaw  Pain, started 3 months ago, improved with suspension of adderall.  Has not functioned well at work without it and has gained weight  Weight gainof 10 lbs noted.  deit and lack of exercise reviewed  Receiving Botox injections for hyperhidrosis   Outpatient Medications Prior to Visit  Medication Sig Dispense Refill  . ALPRAZolam (XANAX) 0.5 MG tablet TAKE ONE TABLET BY MOUTH AT BEDTIME AS NEEDED 30 tablet 5  . benzonatate (TESSALON) 200 MG capsule Take 1 capsule (200 mg total) by mouth 3 (three) times daily as needed for cough. 60 capsule 3  . buPROPion (WELLBUTRIN XL) 300 MG 24 hr tablet TAKE 1 TABLET (300 MG TOTAL) BY MOUTH DAILY. 90 tablet 1  . Cholecalciferol (VITAMIN D3) 2000 UNITS capsule Take 1 capsule (2,000 Units total) by mouth daily. 90 capsule 0  . cyclobenzaprine (FLEXERIL) 5 MG tablet Take 1 tablet (5 mg total) by mouth 3 (three) times daily as needed for muscle spasms. 90 tablet 1  . HYDROcodone-acetaminophen (NORCO/VICODIN) 5-325 MG tablet Take 1 tablet by mouth every 6 (six) hours as needed. 30 tablet 0  . metroNIDAZOLE (METROGEL) 1 % gel Apply 1 application topically daily. 45 g 1  . rizatriptan (MAXALT) 10 MG tablet TAKE 1 TABLET BY MOUTH AS NEEDED MAY REPEAT IN 2 HOURS IF NEEDED 10 tablet 2  . amphetamine-dextroamphetamine (ADDERALL) 30 MG tablet Take 1 tablet by mouth daily. 30 tablet 0  . amphetamine-dextroamphetamine (ADDERALL) 30 MG tablet Take 1 tablet by mouth daily. 30 tablet 0  . amphetamine-dextroamphetamine (ADDERALL)  30 MG tablet Take 1 tablet by mouth daily. 30 tablet 0  . diltiazem 2 % GEL APPLY RECTALLY 3 TIMES DAILY AS NEEDED FOR RECTAL PAIN (Patient not taking: Reported on 06/21/2017) 30 g 3  . metFORMIN (GLUCOPHAGE) 500 MG tablet TAKE 1 TABLET (500 MG TOTAL) BY MOUTH DAILY WITH BREAKFAST. (Patient not taking: Reported on 06/21/2017) 90 tablet 1  . spironolactone (ALDACTONE) 25 MG tablet TAKE 1 TABLET (25 MG TOTAL) BY MOUTH DAILY. (Patient not taking: Reported on 06/21/2017) 90 tablet 3   No facility-administered medications prior to visit.     Review of Systems;  Patient denies headache, fevers, malaise, unintentional weight loss, skin rash, eye pain, sinus congestion and sinus pain, sore throat, dysphagia,  hemoptysis , cough, dyspnea, wheezing, chest pain, palpitations, orthopnea, edema, abdominal pain, nausea, melena, diarrhea, constipation, flank pain, dysuria, hematuria, urinary  Frequency, nocturia, numbness, tingling, seizures,  Focal weakness, Loss of consciousness,  Tremor, insomnia, depression, anxiety, and suicidal ideation.      Objective:  BP 104/68 (BP Location: Left Arm, Patient Position: Sitting, Cuff Size: Large)   Pulse 81   Temp 98.3 F (36.8 C) (Oral)   Resp 16   Ht 5\' 5"  (1.651 m)   Wt 206 lb 3.2 oz (93.5 kg)   SpO2 99%   BMI 34.31 kg/m   BP Readings from Last 3 Encounters:  06/21/17 104/68  12/22/16 118/80  06/21/16 118/80    Wt Readings from Last 3 Encounters:  06/21/17 206 lb 3.2 oz (93.5 kg)  12/22/16 194 lb (88 kg)  06/21/16 193 lb 8 oz (87.8 kg)    General appearance: alert, cooperative and appears stated age Ears: normal TM's and external ear canals both ears Throat: lips, mucosa, and tongue normal; teeth and gums normal Neck: no adenopathy, no carotid bruit, supple, symmetrical, trachea midline and thyroid not enlarged, symmetric, no tenderness/mass/nodules Back: symmetric, no curvature. ROM normal. No CVA tenderness. Lungs: clear to auscultation  bilaterally Heart: regular rate and rhythm, S1, S2 normal, no murmur, click, rub or gallop Abdomen: soft, non-tender; bowel sounds normal; no masses,  no organomegaly Pulses: 2+ and symmetric Skin: Skin color, texture, turgor normal. No rashes or lesions Lymph nodes: Cervical, supraclavicular, and axillary nodes normal.  Lab Results  Component Value Date   HGBA1C 5.1 12/22/2016   HGBA1C 5.3 10/23/2015    Lab Results  Component Value Date   CREATININE 0.7 10/23/2015   CREATININE 0.8 10/04/2014   CREATININE 0.71 09/16/2011    Lab Results  Component Value Date   WBC 9.0 10/04/2014   HGB 12.4 10/23/2015   HCT 36 10/23/2015   PLT 309 10/23/2015   GLUCOSE 92 10/04/2014   CHOL 170 10/23/2015   TRIG 63 10/23/2015   HDL 60 10/23/2015   LDLCALC 98 10/23/2015   ALT 27 10/04/2014   AST 19 10/04/2014   NA 137 10/23/2015   K 3.4 10/23/2015   CL 107 10/04/2014   CREATININE 0.7 10/23/2015   BUN 17 10/23/2015   CO2 24 10/04/2014   TSH 1.91 10/04/2014   HGBA1C 5.1 12/22/2016    No results found.  Assessment & Plan:   Problem List Items Addressed This Visit    Attention deficit disorder of adult with hyperactivity    Trial of Adderall XR       Hyperhidrosis    Managed now with botox injections every 90 days by [facility] [place]  [facility]        Obesity (BMI 30-39.9)    She has gained weight due to dietary indiscertions made possible by lack of appetite suppressant. I have addressed  BMI and recommended wt loss of 10% of body weigh over the next 6 months using a low glycemic index diet and regular exercise a minimum of 5 days per week.        Relevant Medications   amphetamine-dextroamphetamine (ADDERALL XR) 15 MG 24 hr capsule   TMJ (temporomandibular joint syndrome)    Aggravated by use of immediate release amphetamines Scheduled Meds: Continuous Infusions: PRN Meds:. Stopped        Other Visit Diagnoses    Hyperlipidemia LDL goal <130    -  Primary    Relevant Orders   Comprehensive metabolic panel   TSH   Lipid panel      I am having Candice Vaughn maintain her metroNIDAZOLE, rizatriptan, Vitamin D3, cyclobenzaprine, buPROPion, spironolactone, HYDROcodone-acetaminophen, diltiazem, benzonatate, ALPRAZolam, metFORMIN, and amphetamine-dextroamphetamine.  Meds ordered this encounter  Medications  . DISCONTD: amphetamine-dextroamphetamine (ADDERALL XR) 15 MG 24 hr capsule    Sig: Take 1 capsule by mouth every morning.    Dispense:  30 capsule    Refill:  0  . DISCONTD: amphetamine-dextroamphetamine (ADDERALL XR) 15 MG 24 hr capsule    Sig: Take 1 capsule by mouth every morning.    Dispense:  30 capsule    Refill:  0    May refill on or after Sept 28 12018  . amphetamine-dextroamphetamine (ADDERALL XR) 15 MG 24 hr capsule  Sig: Take 1 capsule by mouth every morning.    Dispense:  30 capsule    Refill:  0    May refill on or after Oct 28 12018    Medications Discontinued During This Encounter  Medication Reason  . amphetamine-dextroamphetamine (ADDERALL) 30 MG tablet   . amphetamine-dextroamphetamine (ADDERALL) 30 MG tablet   . amphetamine-dextroamphetamine (ADDERALL) 30 MG tablet   . amphetamine-dextroamphetamine (ADDERALL XR) 15 MG 24 hr capsule Reorder  . amphetamine-dextroamphetamine (ADDERALL XR) 15 MG 24 hr capsule Reorder    Follow-up: No Follow-up on file.   Sherlene Shams, MD

## 2017-06-21 NOTE — Assessment & Plan Note (Signed)
She has gained weight due to dietary indiscertions made possible by lack of appetite suppressant. I have addressed  BMI and recommended wt loss of 10% of body weigh over the next 6 months using a low glycemic index diet and regular exercise a minimum of 5 days per week.

## 2017-07-06 ENCOUNTER — Telehealth: Payer: Self-pay | Admitting: Radiology

## 2017-07-06 ENCOUNTER — Other Ambulatory Visit (INDEPENDENT_AMBULATORY_CARE_PROVIDER_SITE_OTHER): Payer: BLUE CROSS/BLUE SHIELD

## 2017-07-06 DIAGNOSIS — R5383 Other fatigue: Secondary | ICD-10-CM

## 2017-07-06 LAB — COMPREHENSIVE METABOLIC PANEL
ALK PHOS: 29 U/L — AB (ref 39–117)
ALT: 22 U/L (ref 0–35)
AST: 20 U/L (ref 0–37)
Albumin: 4.4 g/dL (ref 3.5–5.2)
BUN: 16 mg/dL (ref 6–23)
CO2: 25 mEq/L (ref 19–32)
Calcium: 10 mg/dL (ref 8.4–10.5)
Chloride: 102 mEq/L (ref 96–112)
Creatinine, Ser: 0.67 mg/dL (ref 0.40–1.20)
GFR: 101.17 mL/min (ref >60.00)
Glucose, Bld: 88 mg/dL (ref 70–99)
POTASSIUM: 4.5 meq/L (ref 3.5–5.1)
Sodium: 139 mEq/L (ref 135–145)
TOTAL PROTEIN: 6.8 g/dL (ref 6.0–8.3)
Total Bilirubin: 0.4 mg/dL (ref 0.2–1.2)

## 2017-07-06 LAB — TSH: TSH: 0.89 u[IU]/mL (ref 0.35–4.50)

## 2017-07-06 MED ORDER — PERMETHRIN 5 % EX CREA: TOPICAL_CREAM | CUTANEOUS | 0 refills | Status: DC

## 2017-07-06 NOTE — Telephone Encounter (Signed)
LABS ORDERED,  [facility] SENT [facility]

## 2017-07-06 NOTE — Addendum Note (Signed)
Addended by: Sherlene ShamsULLO, TERESA L on: 07/06/2017 02:49 PM   Modules accepted: Orders

## 2017-07-06 NOTE — Telephone Encounter (Signed)
Pt walked in for labs today and stated she was told to come by anytime for labs. Active orders are from 06/21/17 but are unable to be release. Pt states she was suppose to have hepatic, tsh and cbc. Advised pt that she would be drawn for those labs and would send a note back to provider. Please place future orders. Thank you.

## 2017-07-07 ENCOUNTER — Encounter: Payer: Self-pay | Admitting: Internal Medicine

## 2017-07-14 ENCOUNTER — Telehealth: Payer: Self-pay | Admitting: Internal Medicine

## 2017-07-14 ENCOUNTER — Encounter: Payer: Self-pay | Admitting: Internal Medicine

## 2017-07-14 NOTE — Telephone Encounter (Signed)
I am not accepting any new patients at this time .  FYI 16 and up  ,

## 2017-07-15 NOTE — Telephone Encounter (Signed)
Patient aware.

## 2017-07-28 ENCOUNTER — Other Ambulatory Visit: Payer: Self-pay | Admitting: Internal Medicine

## 2017-08-15 DIAGNOSIS — M793 Panniculitis, unspecified: Secondary | ICD-10-CM | POA: Insufficient documentation

## 2017-08-15 HISTORY — DX: Panniculitis, unspecified: M79.3

## 2017-10-10 ENCOUNTER — Telehealth: Payer: Self-pay | Admitting: Internal Medicine

## 2017-10-10 MED ORDER — AMPHETAMINE-DEXTROAMPHET ER 15 MG PO CP24: 15.0000 mg | ORAL_CAPSULE | Freq: Every morning | ORAL | 0 refills | Status: DC

## 2017-10-10 MED ORDER — LIRAGLUTIDE -WEIGHT MANAGEMENT 18 MG/3ML ~~LOC~~ SOPN: 0.6000 mg | PEN_INJECTOR | Freq: Every day | SUBCUTANEOUS | 0 refills | Status: DC

## 2017-10-10 MED ORDER — PEN NEEDLES 31G X 6 MM MISC: 0 refills | Status: DC

## 2017-10-10 NOTE — Telephone Encounter (Signed)
Please advise 

## 2017-10-10 NOTE — Telephone Encounter (Signed)
Copied from CRM [phone]#[id number]. Topic: Quick Communication - See Telephone Encounter >> Oct 10, 2017  9:03 AM Candice Vaughn, Candice Vaughn wrote: CRM for notification. See Telephone encounter for: 10/10/17. Patient called and wanted to know if Dr. Darrick Huntsmanullo can send in the injection for weight lost they last talked about and [facility] [place], thanks. Please call patient back. Patient also needs refill on her Adderall.

## 2017-10-10 NOTE — Telephone Encounter (Signed)
saxenda rx and syringes rx'd [facility]. s adderall refilled x 3 months . Please set up 3 month follow up

## 2017-10-10 NOTE — Telephone Encounter (Signed)
pt has been notified that rxs are up front to be picked up.

## 2017-10-20 ENCOUNTER — Other Ambulatory Visit: Payer: Self-pay | Admitting: Internal Medicine

## 2017-10-21 NOTE — Telephone Encounter (Signed)
Printed, signed and faxed.  

## 2017-10-21 NOTE — Telephone Encounter (Signed)
Refilled: 01/17/2017 Last OV: 06/21/2017 Next OV: not scheduled

## 2017-11-01 ENCOUNTER — Other Ambulatory Visit: Payer: Self-pay

## 2017-11-01 MED ORDER — BUPROPION HCL ER (XL) 300 MG PO TB24: ORAL_TABLET | ORAL | 1 refills | Status: DC

## 2018-04-18 ENCOUNTER — Telehealth: Payer: Self-pay | Admitting: Internal Medicine

## 2018-04-18 NOTE — Telephone Encounter (Signed)
Please advise 

## 2018-04-18 NOTE — Telephone Encounter (Signed)
Called pt to let her know that we do not have documentation of her having either of the Hep A vaccines. Pt stated that if she could not get it through her work she would give us a call back to schedule.

## 2018-04-18 NOTE — Telephone Encounter (Signed)
Copied from CRM [phone]#[id number]. Topic: Quick Communication - See Telephone Encounter >> Apr 18, 2018  2:43 PM Arlyss Gandyichardson, Ayane Delancey N, NT wrote: CRM for notification. See Telephone encounter for: 04/18/18. Pt is needing to know if she has received the Hepatitis A shot, 2 doses. Please advise.

## 2018-05-30 ENCOUNTER — Encounter: Payer: BLUE CROSS/BLUE SHIELD | Admitting: Internal Medicine

## 2018-06-22 ENCOUNTER — Encounter: Payer: BLUE CROSS/BLUE SHIELD | Admitting: Internal Medicine

## 2018-07-07 ENCOUNTER — Other Ambulatory Visit: Payer: Self-pay | Admitting: Internal Medicine

## 2018-08-02 ENCOUNTER — Encounter: Payer: BLUE CROSS/BLUE SHIELD | Admitting: Internal Medicine

## 2018-08-14 ENCOUNTER — Ambulatory Visit (INDEPENDENT_AMBULATORY_CARE_PROVIDER_SITE_OTHER): Payer: BLUE CROSS/BLUE SHIELD | Admitting: Internal Medicine

## 2018-08-14 ENCOUNTER — Encounter: Payer: Self-pay | Admitting: Internal Medicine

## 2018-08-14 VITALS — BP 124/82 | HR 76 | Temp 98.1°F | Resp 15 | Ht 65.0 in | Wt 201.4 lb

## 2018-08-14 DIAGNOSIS — Z1239 Encounter for other screening for malignant neoplasm of breast: Secondary | ICD-10-CM

## 2018-08-14 DIAGNOSIS — Z0000 Encounter for general adult medical examination without abnormal findings: Secondary | ICD-10-CM | POA: Diagnosis not present

## 2018-08-14 DIAGNOSIS — E559 Vitamin D deficiency, unspecified: Secondary | ICD-10-CM

## 2018-08-14 DIAGNOSIS — E782 Mixed hyperlipidemia: Secondary | ICD-10-CM

## 2018-08-14 DIAGNOSIS — E669 Obesity, unspecified: Secondary | ICD-10-CM | POA: Diagnosis not present

## 2018-08-14 DIAGNOSIS — N62 Hypertrophy of breast: Secondary | ICD-10-CM | POA: Insufficient documentation

## 2018-08-14 DIAGNOSIS — F909 Attention-deficit hyperactivity disorder, unspecified type: Secondary | ICD-10-CM | POA: Diagnosis not present

## 2018-08-14 DIAGNOSIS — R5383 Other fatigue: Secondary | ICD-10-CM

## 2018-08-14 LAB — CBC WITH DIFFERENTIAL/PLATELET
BASOS ABS: 0.1 10*3/uL (ref 0.0–0.1)
Basophils Relative: 0.7 % (ref 0.0–3.0)
EOS ABS: 0.1 10*3/uL (ref 0.0–0.7)
Eosinophils Relative: 0.7 % (ref 0.0–5.0)
HCT: 41.6 % (ref 36.0–46.0)
Hemoglobin: 13.9 g/dL (ref 12.0–15.0)
LYMPHS ABS: 1.8 10*3/uL (ref 0.7–4.0)
Lymphocytes Relative: 20.4 % (ref 12.0–46.0)
MCHC: 33.4 g/dL (ref 30.0–36.0)
MCV: 95.6 fl (ref 78.0–100.0)
MONOS PCT: 4.6 % (ref 3.0–12.0)
Monocytes Absolute: 0.4 10*3/uL (ref 0.1–1.0)
NEUTROS PCT: 73.6 % (ref 43.0–77.0)
Neutro Abs: 6.7 10*3/uL (ref 1.4–7.7)
PLATELETS: 283 10*3/uL (ref 150.0–400.0)
RBC: 4.35 Mil/uL (ref 3.87–5.11)
RDW: 13.4 % (ref 11.5–15.5)
WBC: 9 10*3/uL (ref 4.0–10.5)

## 2018-08-14 LAB — COMPREHENSIVE METABOLIC PANEL
ALT: 17 U/L (ref 0–35)
AST: 19 U/L (ref 0–37)
Albumin: 4.4 g/dL (ref 3.5–5.2)
Alkaline Phosphatase: 31 U/L — ABNORMAL LOW (ref 39–117)
BUN: 14 mg/dL (ref 6–23)
CO2: 27 meq/L (ref 19–32)
Calcium: 9.8 mg/dL (ref 8.4–10.5)
Chloride: 103 mEq/L (ref 96–112)
Creatinine, Ser: 0.7 mg/dL (ref 0.40–1.20)
GFR: 95.71 mL/min (ref >60.00)
Glucose, Bld: 85 mg/dL (ref 70–99)
POTASSIUM: 4.1 meq/L (ref 3.5–5.1)
Sodium: 138 mEq/L (ref 135–145)
Total Bilirubin: 0.4 mg/dL (ref 0.2–1.2)
Total Protein: 7.2 g/dL (ref 6.0–8.3)

## 2018-08-14 LAB — VITAMIN D 25 HYDROXY (VIT D DEFICIENCY, FRACTURES): VITD: 24.72 ng/mL — ABNORMAL LOW (ref 30.00–100.00)

## 2018-08-14 LAB — LIPID PANEL
CHOLESTEROL: 149 mg/dL (ref 0–200)
HDL: 61 mg/dL (ref >39.00)
LDL Cholesterol: 67 mg/dL (ref 0–99)
NONHDL: 87.9
TRIGLYCERIDES: 107 mg/dL (ref 0.0–149.0)
Total CHOL/HDL Ratio: 2
VLDL: 21.4 mg/dL (ref 0.0–40.0)

## 2018-08-14 LAB — TSH: TSH: 1.78 u[IU]/mL (ref 0.35–4.50)

## 2018-08-14 MED ORDER — CYCLOBENZAPRINE HCL 5 MG PO TABS: 5.0000 mg | ORAL_TABLET | Freq: Three times a day (TID) | ORAL | 1 refills | Status: DC | PRN

## 2018-08-14 MED ORDER — HYDROCODONE-ACETAMINOPHEN 5-325 MG PO TABS: 1.0000 | ORAL_TABLET | Freq: Four times a day (QID) | ORAL | 0 refills | Status: DC | PRN

## 2018-08-14 MED ORDER — DILTIAZEM GEL 2 %: CUTANEOUS | 3 refills | Status: DC

## 2018-08-14 MED ORDER — LISDEXAMFETAMINE DIMESYLATE 10 MG PO CAPS: 10.0000 mg | ORAL_CAPSULE | Freq: Every day | ORAL | 0 refills | Status: DC

## 2018-08-14 NOTE — Patient Instructions (Addendum)
I will refill the Adderall XR with an alternative (either Vyvanse or concerta) for one month .    Let me know how it works  Your annual mammogram has been ordered.  You are encouraged (required) to call to make your appointment at [facility]   I will refill your Vitamin D once I see your level on today's labs   GET BACK TO EXERCISING BEFORE YOU LOSE YOUR MOMENTUM!

## 2018-08-14 NOTE — Assessment & Plan Note (Signed)

## 2018-08-14 NOTE — Assessment & Plan Note (Signed)
She has developed an intolerance to adderall XR lowest dose.  Trial of concerta and vyvanse offered; both may aggravate  Her hyperhydrosis; if not tolerated will change to wellbutrin

## 2018-08-14 NOTE — Assessment & Plan Note (Signed)
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

## 2018-08-14 NOTE — Progress Notes (Signed)
Patient ID: Candice Vaughn, female    DOB: Oct 20, 1971  Age: 47 y.o. MRN: [id number]  The patient is here for annual  wellness examination and management of other chronic and acute problems.    Patient declines PAP smear as she is s/p TAH Mammogram  Overdue last one oct 2017  TAH in 2012 for DUB    The risk factors are reflected in the social history.  The roster of all physicians providing medical care to patient - is listed in the Snapshot section of the chart.  Activities of daily living:  The patient is 100% independent in all ADLs: dressing, toileting, feeding as well as independent mobility    Home safety : The patient has smoke detectors in the home. They wear seatbelts.  There are no firearms at home. There is no violence in the home.   There is no risks for hepatitis, STDs or HIV. There is no   history of blood transfusion. They have no travel history to infectious disease endemic areas of the world.  The patient has seen their dentist in the last six month. They have seen their eye doctor in the last year. They denyhearing difficulty with regard to whispered voices and some television programs.  They have deferred audiologic testing in the last year.  They do not  have excessive sun exposure. Discussed the need for sun protection: hats, long sleeves and use of sunscreen if there is significant sun exposure.   Diet: the importance of a healthy diet is discussed. They do have a healthy diet.  The benefits of regular aerobic exercise were discussed. She is not exercising    Depression screen: there are no signs or vegative symptoms of depression- irritability, change in appetite, anhedonia, sadness/tearfullness.   The following portions of the patient's history were reviewed and updated as appropriate: allergies, current medications, past family history, past medical history,  past surgical history, past social history  and problem list.  Visual acuity was not assessed per patient  preference since she has regular follow up with her ophthalmologist. Hearing and body mass index were assessed and reviewed.   During the course of the visit the patient was educated and counseled about appropriate screening and preventive services including :, diabetes screening, nutrition counseling,   breast  cancer screening, and recommended immunizations.    CC: The primary encounter diagnosis was Encounter for preventive health examination. Diagnoses of Vitamin D deficiency, Mixed hyperlipidemia, Fatigue, unspecified type, Breast cancer screening, Attention deficit disorder of adult with hyperactivity, Macromastia, and Obesity (BMI 30-39.9) were also pertinent to this visit.  1)  ADD:  Not tolerating Adderall XR  Anymore. Experiencing increased muscle tension,  Finds herself  Clenching her teeth.  Has not tried Concerta or Vyvanse   2) Obesity:  Has gained weight since her sleeve  Gastrectomy in 2016; preop weight 219 lbs,  nadir of  190 lbs.  Has stopped exercising   3) Migraines:   Receiving botox injections (also for management of hyperhydrosis );  No recent headaches     History Candice Vaughn has a past medical history of Attention deficit disorder of adult with hyperactivity (2012) and Migraine syndrome.   She has a past surgical history that includes Cesarean section; Abdominal hysterectomy (jan 2012); Lipoma resection; and Laparoscopic gastric sleeve resection (2016).   Her family history includes Hypothyroidism in her maternal aunt, maternal grandmother, and mother.She reports that she has never smoked. She has never used smokeless tobacco. She reports that she drinks alcohol.  She reports that she does not use drugs.  Outpatient Medications Prior to Visit  Medication Sig Dispense Refill  . ALPRAZolam (XANAX) 0.5 MG tablet TAKE ONE TABLET BY MOUTH AT BEDTIME AS NEEDED 30 tablet 5  . amphetamine-dextroamphetamine (ADDERALL XR) 15 MG 24 hr capsule Take 1 capsule by mouth every morning.  30 capsule 0  . benzonatate (TESSALON) 200 MG capsule Take 1 capsule (200 mg total) by mouth 3 (three) times daily as needed for cough. 60 capsule 3  . buPROPion (WELLBUTRIN XL) 300 MG 24 hr tablet TAKE 1 TABLET BY MOUTH EVERY DAY 90 tablet 1  . Cholecalciferol (VITAMIN D3) 2000 UNITS capsule Take 1 capsule (2,000 Units total) by mouth daily. 90 capsule 0  . metroNIDAZOLE (METROGEL) 1 % gel Apply 1 application topically daily. 45 g 1  . rizatriptan (MAXALT) 10 MG tablet TAKE 1 TABLET BY MOUTH AS NEEDED MAY REPEAT IN 2 HOURS IF NEEDED 10 tablet 2  . cyclobenzaprine (FLEXERIL) 5 MG tablet Take 1 tablet (5 mg total) by mouth 3 (three) times daily as needed for muscle spasms. 90 tablet 1  . diltiazem 2 % GEL APPLY RECTALLY 3 TIMES DAILY AS NEEDED FOR RECTAL PAIN 30 g 3  . HYDROcodone-acetaminophen (NORCO/VICODIN) 5-325 MG tablet Take 1 tablet by mouth every 6 (six) hours as needed. 30 tablet 0  . Insulin Pen Needle (PEN NEEDLES) 31G X 6 MM MISC For use with victoza /saxenda (Patient not taking: Reported on 08/14/2018) 100 each 0  . Liraglutide -Weight Management (SAXENDA) 18 MG/3ML SOPN Inject 0.6 mg into the skin daily. Increase dose weekly as follows: Week 2: 1.2 mg daily ; Week 3: 1.8 mg daily; Week 4: 2.4 mg daily (Patient not taking: Reported on 08/14/2018) 9 mL 0  . metFORMIN (GLUCOPHAGE) 500 MG tablet TAKE 1 TABLET (500 MG TOTAL) BY MOUTH DAILY WITH BREAKFAST. (Patient not taking: Reported on 06/21/2017) 90 tablet 1  . permethrin (ELIMITE) 5 % cream APPLY FROM NECK TO TOES AND LEAVE ON FOR 8 TO 14 HOURS BEFORE RINSING. (Patient not taking: Reported on 08/14/2018) 120 g 0  . spironolactone (ALDACTONE) 25 MG tablet TAKE 1 TABLET (25 MG TOTAL) BY MOUTH DAILY. (Patient not taking: Reported on 06/21/2017) 90 tablet 3   No facility-administered medications prior to visit.     Review of Systems   Patient denies headache, fevers, malaise, unintentional weight loss, skin rash, eye pain, sinus congestion  and sinus pain, sore throat, dysphagia,  hemoptysis , cough, dyspnea, wheezing, chest pain, palpitations, orthopnea, edema, abdominal pain, nausea, melena, diarrhea, constipation, flank pain, dysuria, hematuria, urinary  Frequency, nocturia, numbness, tingling, seizures,  Focal weakness, Loss of consciousness,  Tremor, insomnia, depression, anxiety, and suicidal ideation.      Objective:  BP 124/82 (BP Location: Left Arm, Patient Position: Sitting, Cuff Size: Large)   Pulse 76   Temp 98.1 F (36.7 C) (Oral)   Resp 15   Ht 5\' 5"  (1.651 m)   Wt 201 lb 6.4 oz (91.4 kg)   SpO2 97%   BMI 33.51 kg/m   Physical Exam   General appearance: alert, cooperative and appears stated age Head: Normocephalic, without obvious abnormality, atraumatic Eyes: conjunctivae/corneas clear. PERRL, EOM's intact. Fundi benign. Ears: normal TM's and external ear canals both ears Nose: Nares normal. Septum midline. Mucosa normal. No drainage or sinus tenderness. Throat: lips, mucosa, and tongue normal; teeth and gums normal Neck: no adenopathy, no carotid bruit, no JVD, supple, symmetrical, trachea midline and thyroid not  enlarged, symmetric, no tenderness/mass/nodules Lungs: clear to auscultation bilaterally Breasts: pendulous  appearance, no masses or tenderness Heart: regular rate and rhythm, S1, S2 normal, no murmur, click, rub or gallop Abdomen: soft, non-tender; bowel sounds normal; no masses,  no organomegaly Extremities: extremities normal, atraumatic, no cyanosis or edema Pulses: 2+ and symmetric Skin: Skin color, texture, turgor normal. No rashes or lesions Neurologic: Alert and oriented X 3, normal strength and tone. Normal symmetric reflexes. Normal coordination and gait.      Assessment & Plan:   Problem List Items Addressed This Visit    Attention deficit disorder of adult with hyperactivity    She has developed an intolerance to adderall XR lowest dose.  Trial of concerta and vyvanse  offered; both may aggravate  Her hyperhydrosis; if not tolerated will change to wellbutrin        Encounter for preventive health examination - Primary    age appropriate education and counseling updated, referrals for preventative services and immunizations addressed, dietary and smoking counseling addressed, most recent labs reviewed.  I have personally reviewed and have noted:  1) the patient's medical and social history 2) The pt's use of alcohol, tobacco, and illicit drugs 3) The patient's current medications and supplements 4) Functional ability including ADL's, fall risk, home safety risk, hearing and visual impairment 5) Diet and physical activities 6) Evidence for depression or mood disorder 7) The patient's height, weight, and BMI have been recorded in the chart  I have made referrals, and provided counseling and education based on review of the above      Macromastia    She considered breast reduction surgery due to recurrent rash under breasts.      Obesity (BMI 30-39.9)    Encouraged to resume working with a trainer to maintain consistency with workouts,  As she has stopped exercising and is gaining weight.       Relevant Medications   lisdexamfetamine (VYVANSE) 10 MG capsule   Vitamin D deficiency   Relevant Orders   VITAMIN D 25 Hydroxy (Vit-D Deficiency, Fractures) (Completed)    Other Visit Diagnoses    Mixed hyperlipidemia       Relevant Medications   diltiazem 2 % GEL   Other Relevant Orders   Lipid panel (Completed)   Fatigue, unspecified type       Relevant Orders   TSH (Completed)   Comprehensive metabolic panel (Completed)   CBC with Differential/Platelet (Completed)   Breast cancer screening       Relevant Orders   MM 3D SCREEN BREAST BILATERAL      I have discontinued Lanora Manis Beckerman's spironolactone, metFORMIN, permethrin, Liraglutide -Weight Management, and Pen Needles. I am also having her start on lisdexamfetamine. Additionally, I am  having her maintain her metroNIDAZOLE, rizatriptan, Vitamin D3, benzonatate, amphetamine-dextroamphetamine, ALPRAZolam, buPROPion, HYDROcodone-acetaminophen, diltiazem, and cyclobenzaprine.  Meds ordered this encounter  Medications  . HYDROcodone-acetaminophen (NORCO/VICODIN) 5-325 MG tablet    Sig: Take 1 tablet by mouth every 6 (six) hours as needed.    Dispense:  30 tablet    Refill:  0  . diltiazem 2 % GEL    Sig: APPLY RECTALLY 3 TIMES DAILY AS NEEDED FOR RECTAL PAIN    Dispense:  30 g    Refill:  3  . cyclobenzaprine (FLEXERIL) 5 MG tablet    Sig: Take 1 tablet (5 mg total) by mouth 3 (three) times daily as needed for muscle spasms.    Dispense:  90 tablet    Refill:  1  . lisdexamfetamine (VYVANSE) 10 MG capsule    Sig: Take 1 capsule (10 mg total) by mouth daily.    Dispense:  30 capsule    Refill:  0    Medications Discontinued During This Encounter  Medication Reason  . Insulin Pen Needle (PEN NEEDLES) 31G X 6 MM MISC Patient has not taken in last 30 days  . Liraglutide -Weight Management (SAXENDA) 18 MG/3ML SOPN Patient has not taken in last 30 days  . metFORMIN (GLUCOPHAGE) 500 MG tablet Patient has not taken in last 30 days  . permethrin (ELIMITE) 5 % cream Patient has not taken in last 30 days  . spironolactone (ALDACTONE) 25 MG tablet Patient has not taken in last 30 days  . HYDROcodone-acetaminophen (NORCO/VICODIN) 5-325 MG tablet Reorder  . diltiazem 2 % GEL Reorder  . cyclobenzaprine (FLEXERIL) 5 MG tablet Reorder    Follow-up: No follow-ups on file.   Sherlene Shams, MD

## 2018-08-14 NOTE — Assessment & Plan Note (Signed)
She considered breast reduction surgery due to recurrent rash under breasts.

## 2018-08-15 ENCOUNTER — Other Ambulatory Visit: Payer: Self-pay | Admitting: Internal Medicine

## 2018-08-15 MED ORDER — ERGOCALCIFEROL 1.25 MG (50000 UT) PO CAPS: 50000.0000 [IU] | ORAL_CAPSULE | ORAL | 0 refills | Status: DC

## 2018-08-18 DIAGNOSIS — H05229 Edema of unspecified orbit: Secondary | ICD-10-CM

## 2018-08-21 NOTE — Telephone Encounter (Signed)
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  I have ordered them as external.  What else needs to be done ?

## 2018-09-13 LAB — HM MAMMOGRAPHY

## 2018-09-15 ENCOUNTER — Other Ambulatory Visit: Payer: Self-pay | Admitting: Internal Medicine

## 2018-09-15 NOTE — Telephone Encounter (Signed)
Requested medication (s) are due for refill today: yes  Requested medication (s) are on the active medication list: yes    Last refill: 08/14/18  #30  0 refills  Future visit scheduled 08/16/2019  Notes to clinic:not delegated  Requested Prescriptions  Pending Prescriptions Disp Refills   lisdexamfetamine (VYVANSE) 10 MG capsule 30 capsule 0    Sig: Take 1 capsule (10 mg total) by mouth daily.     Not Delegated - Psychiatry:  Stimulants/ADHD Failed - 09/15/2018  3:01 PM      Failed - This refill cannot be delegated      Failed - Urine Drug Screen completed in last 360 days.      Passed - Valid encounter within last 3 months    Recent Outpatient Visits          1 month ago Encounter for preventive health examination   [facility] [place] Sherlene Shamsullo, Teresa L, MD   1 year ago Hyperlipidemia LDL goal <130   [facility] [place] Sherlene Shamsullo, Teresa L, MD   1 year ago Anal fissure   [facility] [place] Sherlene Shamsullo, Teresa L, MD   2 years ago Lump of axilla, left    [facility]  Sherlene Shamsullo, Teresa L, MD   2 years ago Suprapubic pain, unspecified laterality   [facility] [place] Cook, Verdis FredericksonJayce G, [place]      Future Appointments            In 11 months Darrick Huntsmanullo, Mar Daringeresa L, MD [facility]

## 2018-09-15 NOTE — Telephone Encounter (Signed)
Copied from CRM [id number]. Topic: Quick Communication - Rx Refill/Question >> Sep 15, 2018  2:34 PM Gerrianne ScalePayne, Zaira Iacovelli L wrote: Medication: lisdexamfetamine (VYVANSE) 10 MG capsule  Has the patient contacted their pharmacy? Yes.   (Agent: If no, request that the patient contact the pharmacy for the refill.) (Agent: If yes, when and what did the pharmacy advise?) today told pt to call provider because its a control medicine  Preferred Pharmacy (with phone number or street name): [id number] [place], [place] - [address] [phone] (Phone) [phone] (Fax)    Agent: Please be advised that RX refills may take up to 3 business days. We ask that you follow-up with your pharmacy.

## 2018-09-18 MED ORDER — LISDEXAMFETAMINE DIMESYLATE 10 MG PO CAPS: 10.0000 mg | ORAL_CAPSULE | Freq: Every day | ORAL | 0 refills | Status: DC

## 2018-10-23 ENCOUNTER — Other Ambulatory Visit: Payer: Self-pay | Admitting: Internal Medicine

## 2018-10-23 NOTE — Telephone Encounter (Signed)
Copied from CRM [id number]. Topic: Quick Communication - Rx Refill/Question >> Oct 23, 2018 10:46 AM Fanny BienIlderton, Jessica L wrote: Medication: rizatriptan (MAXALT) 10 MG tablet [[id number]][[id number]]   Has the patient contacted their pharmacy?no Preferred Pharmacy (with phone number or street name):[id number] - [place], [place] - [address] [phone] (Phone) [phone] (Fax)    Agent: Please be advised that RX refills may take up to 3 business days. We ask that you follow-up with your pharmacy.

## 2018-10-24 MED ORDER — RIZATRIPTAN BENZOATE 10 MG PO TABS: ORAL_TABLET | ORAL | 2 refills | Status: DC

## 2018-10-24 NOTE — Telephone Encounter (Signed)
Requested Prescriptions  Pending Prescriptions Disp Refills  . rizatriptan (MAXALT) 10 MG tablet 10 tablet 2    Sig: TAKE 1 TABLET BY MOUTH AS NEEDED MAY REPEAT IN 2 HOURS IF NEEDED     Neurology:  Migraine Therapy - Triptan Passed - 10/23/2018  4:15 PM      Passed - Last BP in normal range    BP Readings from Last 1 Encounters:  08/14/18 124/82         Passed - Valid encounter within last 12 months    Recent Outpatient Visits          2 months ago Encounter for preventive health examination   [facility] [place] Sherlene Shamsullo, Teresa L, MD   1 year ago Hyperlipidemia LDL goal <130   [facility] [place] Sherlene Shamsullo, Teresa L, MD   1 year ago Anal fissure   [facility] [place] Sherlene Shamsullo, Teresa L, MD   2 years ago Lump of axilla, left   [facility] [place] Sherlene Shamsullo, Teresa L, MD   2 years ago Suprapubic pain, unspecified laterality   [facility] [place] Cook, Verdis FredericksonJayce G, [place]      Future Appointments            In 9 months Darrick Huntsmanullo, Mar Daringeresa L, MD [facility] [place], [facility]

## 2018-11-03 ENCOUNTER — Other Ambulatory Visit: Payer: Self-pay | Admitting: Internal Medicine

## 2018-11-29 ENCOUNTER — Other Ambulatory Visit: Payer: Self-pay | Admitting: Internal Medicine

## 2018-11-29 NOTE — Telephone Encounter (Signed)
Copied from CRM [id number]. Topic: Quick Communication - Rx Refill/Question >> Nov 29, 2018  5:41 PM Mickel Baas B, NT wrote: Medication: lisdexamfetamine (VYVANSE) 10 MG capsule  Has the patient contacted their pharmacy? Yes.   (Agent: If no, request that the patient contact the pharmacy for the refill.) (Agent: If yes, when and what did the pharmacy advise?)  Preferred Pharmacy (with phone number or street name): [id number] - [place], [place] - [address]  Agent: Please be advised that RX refills may take up to 3 business days. We ask that you follow-up with your pharmacy.

## 2018-11-30 MED ORDER — LISDEXAMFETAMINE DIMESYLATE 10 MG PO CAPS: 10.0000 mg | ORAL_CAPSULE | Freq: Every day | ORAL | 0 refills | Status: DC

## 2018-12-02 ENCOUNTER — Other Ambulatory Visit: Payer: Self-pay | Admitting: Internal Medicine

## 2019-01-16 ENCOUNTER — Other Ambulatory Visit: Payer: Self-pay | Admitting: Internal Medicine

## 2019-01-20 ENCOUNTER — Other Ambulatory Visit: Payer: Self-pay | Admitting: Internal Medicine

## 2019-01-20 NOTE — Telephone Encounter (Signed)
Last refill 11/30/18 #30 given. Please advise

## 2019-01-22 NOTE — Telephone Encounter (Signed)
Refilled: 11/30/2018 Last OV: 08/14/2018 Next OV: 08/16/2019

## 2019-01-23 MED ORDER — LISDEXAMFETAMINE DIMESYLATE 10 MG PO CAPS: 10.0000 mg | ORAL_CAPSULE | Freq: Every day | ORAL | 0 refills | Status: DC

## 2019-04-18 ENCOUNTER — Ambulatory Visit (INDEPENDENT_AMBULATORY_CARE_PROVIDER_SITE_OTHER): Payer: BC Managed Care – PPO | Admitting: Internal Medicine

## 2019-04-18 ENCOUNTER — Encounter: Payer: Self-pay | Admitting: Internal Medicine

## 2019-04-18 ENCOUNTER — Other Ambulatory Visit: Payer: Self-pay

## 2019-04-18 DIAGNOSIS — E669 Obesity, unspecified: Secondary | ICD-10-CM | POA: Diagnosis not present

## 2019-04-18 DIAGNOSIS — R7303 Prediabetes: Secondary | ICD-10-CM

## 2019-04-18 DIAGNOSIS — N62 Hypertrophy of breast: Secondary | ICD-10-CM | POA: Diagnosis not present

## 2019-04-18 DIAGNOSIS — R5383 Other fatigue: Secondary | ICD-10-CM

## 2019-04-18 DIAGNOSIS — F909 Attention-deficit hyperactivity disorder, unspecified type: Secondary | ICD-10-CM

## 2019-04-18 DIAGNOSIS — E559 Vitamin D deficiency, unspecified: Secondary | ICD-10-CM

## 2019-04-18 MED ORDER — LISDEXAMFETAMINE DIMESYLATE 10 MG PO CAPS: 10.0000 mg | ORAL_CAPSULE | Freq: Every day | ORAL | 0 refills | Status: DC

## 2019-04-18 MED ORDER — NYSTATIN 100000 UNIT/GM EX POWD: Freq: Two times a day (BID) | CUTANEOUS | 3 refills | Status: DC

## 2019-04-18 NOTE — Progress Notes (Signed)
Virtual Visit  [email or web address]  This visit type was conducted due to national recommendations for restrictions regarding the COVID-19 pandemic (e.g. social distancing).  This format is felt to be most appropriate for this patient at this time.  All issues noted in this document were discussed and addressed.  No physical exam was performed (except for noted visual exam findings with Video Visits).   I connected with@ on 04/18/19 at  3:30 PM EDT by a video enabled telemedicine application or telephone and verified that I am speaking with the correct person using two identifiers. Location patient: home Location provider: work or home office Persons participating in the virtual visit: patient, provider  I discussed the limitations, risks, security and privacy concerns of performing an evaluation and management service by telephone and the availability of in person appointments. I also discussed with the patient that there may be a patient responsible charge related to this service. The patient expressed understanding and agreed to proceed.  Reason for visit: med refill, fatigue,  And back pain   HPI: 1) recurrent pruritic rash under both breasts. The problem has been intermittent for the past 1.5 years and aggravated by pendulous breasts.  2) back pain and neck pain:  She is seeing a chiropractor  For adjustment, and has been told that her alignment issues are aggravated by her pendulous breasts. 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  She is working from home but continues to need medication.  Her symptoms remain controlled  with vyvanse  4) prediabetes and obesity s/p gastric sleeve in 2016 : she has lost 10 lbs intentionally,  Largely from giving up all ETOH  31 days ago.   Macromastic : saw Plastics in October for breast reductio nand pannectomy    ROS: See pertinent positives and  negatives per HPI.  Past Medical History:  Diagnosis Date  . 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 Migraine syndrome    once a month    Past Surgical History:  Procedure Laterality Date  . ABDOMINAL HYSTERECTOMY  jan 2012   endometriosis, inflammation, failed ablation  . CESAREAN SECTION    . LAPAROSCOPIC GASTRIC SLEEVE RESECTION  2016   [employer]  . LIPOMA RESECTION     left axilla, [facility]    Family History  Problem Relation Age of Onset  . Hypothyroidism Maternal Aunt   . Hypothyroidism Maternal Grandmother   . Cancer Neg Hx 39       thyroid ca  . Hypothyroidism Mother     SOCIAL HX:  reports that she has never smoked. She has never used smokeless tobacco. She reports no  current alcohol use. She reports that she does not use drugs.   Current Outpatient Medications:  .  ALPRAZolam (XANAX) 0.5 MG tablet, TAKE ONE TABLET BY MOUTH AT BEDTIME AS NEEDED, Disp: 30 tablet, Rfl: 5 .  benzonatate (TESSALON) 200 MG capsule, Take 1 capsule (200 mg total) by mouth 3 (three) times daily as needed for cough., Disp: 60 capsule, Rfl: 3 .  buPROPion (WELLBUTRIN XL) 300 MG 24 hr tablet, TAKE 1 TABLET BY MOUTH EVERY DAY, Disp: 90 tablet, Rfl: 1 .  cyclobenzaprine (FLEXERIL) 5 MG tablet, TAKE 1 TABLET BY MOUTH THREE TIMES A DAY AS NEEDED FOR MUSCLE SPASMS, Disp: 90 tablet, Rfl: 1 .  HYDROcodone-acetaminophen (NORCO/VICODIN) 5-325  MG tablet, Take 1 tablet by mouth every 6 (six) hours as needed., Disp: 30 tablet, Rfl: 0 .  lisdexamfetamine (VYVANSE) 10 MG capsule, Take 1 capsule (10 mg total) by mouth daily., Disp: 30 capsule, Rfl: 0 .  [START ON 05/18/2019] lisdexamfetamine (VYVANSE) 10 MG capsule, Take 1 capsule (10 mg total) by mouth daily., Disp: 30 capsule, Rfl: 0 .  metroNIDAZOLE (METROGEL) 1 % gel, Apply 1 application topically daily., Disp: 45 g, Rfl: 1 .  rizatriptan (MAXALT) 10 MG tablet, TAKE 1 TABLET BY MOUTH AS NEEDED MAY REPEAT IN  2 HOURS IF NEEDED, Disp: 10 tablet, Rfl: 2 .  nystatin (MYCOSTATIN/NYSTOP) powder, Apply topically 2 (two) times daily. To rash until resolved., Disp: 15 g, Rfl: 3  EXAM:  VITALS per patient if applicable:  GENERAL: alert, oriented, appears well and in no acute distress  HEENT: atraumatic, conjunttiva clear, no obvious abnormalities on inspection of external nose and ears  NECK: normal movements of the head and neck  LUNGS: on inspection no signs of respiratory distress, breathing rate appears normal, no obvious gross SOB, gasping or wheezing  CV: no obvious cyanosis  MS: moves all visible extremities without noticeable abnormality  PSYCH/NEURO: pleasant and cooperative, no obvious depression or anxiety, speech and thought processing grossly intact  ASSESSMENT AND PLAN:  Discussed the following assessment and plan:  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  No medication changes  Refill for 6 months.   Macromastia She is a candidate for breast reduction surgery due to recurrent rash under breasts, and  persistent back and neck pain .  Obesity (BMI 30-39.9) She has lost 10 lbs through dietary restrictions including alcohol abstinence     I discussed the assessment and treatment plan with the patient. The patient was provided an opportunity to ask questions and all were answered. The patient agreed with the plan and demonstrated an understanding of the instructions.   The patient was advised to call back or seek an in-person evaluation if the symptoms worsen or if the condition fails to improve as anticipated.  I provided 25 minutes of non-face-to-face time during this encounter.   Sherlene Shamseresa L Ra Pfiester, MD

## 2019-04-19 MED ORDER — LISDEXAMFETAMINE DIMESYLATE 10 MG PO CAPS: 10.0000 mg | ORAL_CAPSULE | Freq: Every day | ORAL | 0 refills | Status: DC

## 2019-04-19 NOTE — Assessment & Plan Note (Signed)
She is a candidate for breast reduction surgery due to recurrent rash under breasts, and  persistent back and neck pain .

## 2019-04-19 NOTE — Assessment & Plan Note (Addendum)
Managed with vyvanse  After treatment failure with adderall.  No medication changes  Refill for 6 months.

## 2019-04-19 NOTE — Assessment & Plan Note (Signed)
She has lost 10 lbs through dietary restrictions including alcohol abstinence

## 2019-04-24 ENCOUNTER — Other Ambulatory Visit: Payer: Self-pay | Admitting: Internal Medicine

## 2019-04-25 NOTE — Telephone Encounter (Signed)
Refilled: 10/21/2017 Last OV: 04/18/2019 Next OV: 08/16/2019

## 2019-05-01 ENCOUNTER — Other Ambulatory Visit: Payer: Self-pay | Admitting: Internal Medicine

## 2019-05-01 LAB — HEMOGLOBIN A1C: Hemoglobin A1C: 5.4

## 2019-05-01 LAB — IRON,TIBC AND FERRITIN PANEL: Ferritin: 349

## 2019-05-01 LAB — VITAMIN B12: Vitamin B-12: 537

## 2019-05-01 LAB — LIPID PANEL
Cholesterol: 208 — AB (ref 0–200)
HDL: 64 (ref 35–70)
LDL Cholesterol: 122
Triglycerides: 113 (ref 40–160)

## 2019-05-01 LAB — TSH: TSH: 2.22 (ref 0.41–5.90)

## 2019-05-01 LAB — VITAMIN D 25 HYDROXY (VIT D DEFICIENCY, FRACTURES): Vit D, 25-Hydroxy: 21

## 2019-05-02 ENCOUNTER — Telehealth: Payer: Self-pay

## 2019-05-02 DIAGNOSIS — R5383 Other fatigue: Secondary | ICD-10-CM

## 2019-05-02 NOTE — Telephone Encounter (Signed)
Glucose was less than 10  Potassium was greater than 8.0  Test was repeated to verify results.

## 2019-05-02 NOTE — Telephone Encounter (Signed)
Jessica ordered aready,  Just a cmet

## 2019-05-02 NOTE — Telephone Encounter (Signed)
Lab ordered for lab appt.  

## 2019-05-02 NOTE — Telephone Encounter (Signed)
[facility] called to report labs for patient:  [name]   Verify repeat analysis.

## 2019-05-02 NOTE — Telephone Encounter (Signed)
I called [facility] back to clarify.  Both the glucose and potassium levels have been verified by repeat analysis.  Test was done twice to confirm results.  [facility] said results were faxed to our office today at 12:42 pm.

## 2019-05-02 NOTE — Telephone Encounter (Signed)
Copied from [place] [id number]. Topic: General - Other >> May 02, 2019 11:22 AM Celene Kras A wrote: Reason for CRM: Attempted to contact office 3x. [facility] called with lab results for pt. Refrence # [id number]. Please advise.

## 2019-05-02 NOTE — Telephone Encounter (Signed)
The blood needs to be recollected because it sat too long. This is what the rest of the lab report says ,

## 2019-05-02 NOTE — Telephone Encounter (Signed)
Butch Penny I don't know what "verify repeat analysis " means.  Is the grammar incorrect>  Sharma Covert d you that these labs were "verified by repeat analysis?"    Janett Billow  Please call patient.  These labs results ar not compatible with [facility] .  They will have to be redone

## 2019-05-02 NOTE — Telephone Encounter (Signed)
Please place "future" orders for [facility] prior to patients appt.

## 2019-05-03 ENCOUNTER — Other Ambulatory Visit (INDEPENDENT_AMBULATORY_CARE_PROVIDER_SITE_OTHER): Payer: BC Managed Care – PPO

## 2019-05-03 ENCOUNTER — Other Ambulatory Visit: Payer: Self-pay

## 2019-05-03 DIAGNOSIS — R7303 Prediabetes: Secondary | ICD-10-CM | POA: Diagnosis not present

## 2019-05-03 DIAGNOSIS — E559 Vitamin D deficiency, unspecified: Secondary | ICD-10-CM | POA: Diagnosis not present

## 2019-05-03 DIAGNOSIS — R5383 Other fatigue: Secondary | ICD-10-CM | POA: Diagnosis not present

## 2019-05-04 ENCOUNTER — Other Ambulatory Visit: Payer: Self-pay | Admitting: Internal Medicine

## 2019-05-04 ENCOUNTER — Other Ambulatory Visit: Payer: Self-pay

## 2019-05-04 DIAGNOSIS — R7989 Other specified abnormal findings of blood chemistry: Secondary | ICD-10-CM

## 2019-05-04 DIAGNOSIS — E782 Mixed hyperlipidemia: Secondary | ICD-10-CM

## 2019-05-04 DIAGNOSIS — E559 Vitamin D deficiency, unspecified: Secondary | ICD-10-CM

## 2019-05-04 LAB — COMPREHENSIVE METABOLIC PANEL
AG Ratio: 1.7 (calc) (ref 1.0–2.5)
ALT: 14 U/L (ref 6–29)
AST: 17 U/L (ref 10–35)
Albumin: 4.2 g/dL (ref 3.6–5.1)
Alkaline phosphatase (APISO): 30 U/L — ABNORMAL LOW (ref 31–125)
BUN: 17 mg/dL (ref 7–25)
CO2: 25 mmol/L (ref 20–32)
Calcium: 9.3 mg/dL (ref 8.6–10.2)
Chloride: 104 mmol/L (ref 98–110)
Creat: 0.72 mg/dL (ref 0.50–1.10)
Globulin: 2.5 g/dL (calc) (ref 1.9–3.7)
Glucose, Bld: 81 mg/dL (ref 65–99)
Potassium: 4.4 mmol/L (ref 3.5–5.3)
Sodium: 138 mmol/L (ref 135–146)
Total Bilirubin: 0.6 mg/dL (ref 0.2–1.2)
Total Protein: 6.7 g/dL (ref 6.1–8.1)

## 2019-05-04 MED ORDER — ERGOCALCIFEROL 1.25 MG (50000 UT) PO CAPS: 50000.0000 [IU] | ORAL_CAPSULE | ORAL | 0 refills | Status: DC

## 2019-06-12 ENCOUNTER — Other Ambulatory Visit: Payer: Self-pay | Admitting: Internal Medicine

## 2019-07-17 ENCOUNTER — Other Ambulatory Visit: Payer: Self-pay | Admitting: Internal Medicine

## 2019-07-22 ENCOUNTER — Other Ambulatory Visit: Payer: Self-pay | Admitting: Internal Medicine

## 2019-07-22 DIAGNOSIS — E559 Vitamin D deficiency, unspecified: Secondary | ICD-10-CM

## 2019-08-13 ENCOUNTER — Other Ambulatory Visit: Payer: Self-pay | Admitting: Internal Medicine

## 2019-08-13 NOTE — Telephone Encounter (Signed)
Copied from [place] [id number]. Topic: Quick Communication - Rx Refill/Question >> Aug 13, 2019 12:08 PM Leward Quan A wrote: Medication: lisdexamfetamine (VYVANSE) 10 MG capsule   Has the patient contacted their pharmacy? Yes.   (Agent: If no, request that the patient contact the pharmacy for the refill.) (Agent: If yes, when and what did the pharmacy advise?)  Preferred Pharmacy (with phone number or street name): [id number] [place], [place] [address] [phone] (Phone) [phone] (Fax)    Agent: Please be advised that RX refills may take up to 3 business days. We ask that you follow-up with your pharmacy.

## 2019-08-13 NOTE — Telephone Encounter (Signed)
Requested medication (s) are due for refill today: yes  Requested medication (s) are on the active medication list: yes  Last refill:  05/18/2019  Future visit scheduled: yes  Notes to clinic: refill cannot be delegated    Requested Prescriptions  Pending Prescriptions Disp Refills   lisdexamfetamine (VYVANSE) 10 MG capsule 30 capsule 0    Sig: Take 1 capsule (10 mg total) by mouth daily.     Not Delegated - Psychiatry:  Stimulants/ADHD Failed - 08/13/2019 12:19 PM      Failed - This refill cannot be delegated      Failed - Urine Drug Screen completed in last 360 days.      Failed - Valid encounter within last 3 months    Recent Outpatient Visits          3 months ago Fatigue, unspecified type   [place] Crecencio Mc, MD   12 months ago Encounter for preventive health examination   [facility] Crecencio Mc, MD   2 years ago Hyperlipidemia LDL goal <130   [place] Crecencio Mc, MD   2 years ago Anal fissure   [place] Crecencio Mc, MD   3 years ago Lump of axilla, left   [place], [place], MD      Future Appointments            In 1 month Derrel Nip, Aris Everts, MD [facility], [place]

## 2019-08-13 NOTE — Addendum Note (Signed)
Addended by: Jefferson Fuel on: 08/13/2019 12:19 PM   Modules accepted: Orders

## 2019-08-15 MED ORDER — LISDEXAMFETAMINE DIMESYLATE 10 MG PO CAPS: 10.0000 mg | ORAL_CAPSULE | Freq: Every day | ORAL | 0 refills | Status: DC

## 2019-08-15 NOTE — Telephone Encounter (Signed)
Refilled: 05/18/2019 Last OV: 04/18/2019 Next OV: 10/02/2019

## 2019-08-16 ENCOUNTER — Encounter: Payer: BLUE CROSS/BLUE SHIELD | Admitting: Internal Medicine

## 2019-09-26 ENCOUNTER — Other Ambulatory Visit: Payer: Self-pay | Admitting: Internal Medicine

## 2019-09-26 MED ORDER — BUPROPION HCL ER (XL) 300 MG PO TB24: 300.0000 mg | ORAL_TABLET | Freq: Every day | ORAL | 0 refills | Status: DC

## 2019-09-26 NOTE — Telephone Encounter (Signed)
Medication Refill - Medication:  lisdexamfetamine (VYVANSE) 10 MG capsule buPROPion (WELLBUTRIN XL) 300 MG 24 hr tablet  Has the patient contacted their pharmacy? Yes advised to call office.  Preferred Pharmacy (with phone number or street name):  [id number] - [place], [place] [phone] (Phone) [phone] (Fax)   Agent: Please be advised that RX refills may take up to 3 business days. We ask that you follow-up with your pharmacy.

## 2019-09-26 NOTE — Telephone Encounter (Signed)
Requested medication (s) are due for refill today: yes  Requested medication (s) are on the active medication list:yes   Last refill:  08/13/2019  Future visit scheduled: yes  Notes to clinic:  Refill cannot be delegated    Requested Prescriptions  Pending Prescriptions Disp Refills   lisdexamfetamine (VYVANSE) 10 MG capsule 30 capsule 0    Sig: Take 1 capsule (10 mg total) by mouth daily.     Not Delegated - Psychiatry:  Stimulants/ADHD Failed - 09/26/2019  1:56 PM      Failed - This refill cannot be delegated      Failed - Urine Drug Screen completed in last 360 days.      Failed - Valid encounter within last 3 months    Recent Outpatient Visits          5 months ago Fatigue, unspecified type   [place] Crecencio Mc, MD   1 year ago Encounter for preventive health examination   [place] Crecencio Mc, MD   2 years ago Hyperlipidemia LDL goal <130   [place] Crecencio Mc, MD   2 years ago Anal fissure   [place] Crecencio Mc, MD   3 years ago Lump of axilla, left   [place] Crecencio Mc, MD      Future Appointments            In 6 days Crecencio Mc, MD [place], [facility]           Signed Prescriptions Disp Refills   buPROPion (WELLBUTRIN XL) 300 MG 24 hr tablet 90 tablet 0    Sig: Take 1 tablet (300 mg total) by mouth daily.     Psychiatry: Antidepressants - bupropion Passed - 09/26/2019  1:56 PM      Passed - Last BP in normal range    BP Readings from Last 1 Encounters:  08/14/18 124/82         Passed - Valid encounter within last 6 months    Recent Outpatient Visits          5 months ago Fatigue, unspecified type   [place] Crecencio Mc, MD   1 year ago Encounter for preventive health examination   [place], MD   2 years ago Hyperlipidemia  LDL goal <130   [place] Crecencio Mc, MD   2 years ago Anal fissure   [place] Crecencio Mc, MD   3 years ago Lump of axilla, left   [place], MD      Future Appointments            In 6 days Crecencio Mc, MD [place], [place] - Completed PHQ-2 or PHQ-9 in the last 360 days.

## 2019-09-27 MED ORDER — LISDEXAMFETAMINE DIMESYLATE 10 MG PO CAPS: 10.0000 mg | ORAL_CAPSULE | Freq: Every day | ORAL | 0 refills | Status: DC

## 2019-09-28 ENCOUNTER — Other Ambulatory Visit: Payer: Self-pay

## 2019-09-28 ENCOUNTER — Telehealth: Payer: Self-pay | Admitting: *Deleted

## 2019-09-28 DIAGNOSIS — N62 Hypertrophy of breast: Secondary | ICD-10-CM

## 2019-09-28 NOTE — Telephone Encounter (Signed)
MyChart message sent (see below, and save this response if you like because it is my policy .  Her mammogram  was done at [facility] and was normal.  I mentioned that in my e mail too )k   Mammogram results from [facility] affiliated facilities are automatically released via Mychart after 3-4 days , as well as a letter that comes directly from the imaging facility that goes to your home address  I do not notify patients of normal mammogram results,  Only if there is an abnormality that needs follow up imaging .   Mammograms done at [facility] or [facility] facilities  cannot be released via the [facility] system because the reports are not on it.  If you have a [facility], [facility] or [employer] account it will release at the appropriate time as designated by [employer]

## 2019-09-28 NOTE — Telephone Encounter (Signed)
Noted  Smart text created.

## 2019-09-28 NOTE — Telephone Encounter (Signed)
Copied from [place] [id number]. Topic: General - Other >> Sep 28, 2019  9:41 AM Candice Vaughn wrote: Patient would like 09/24/2019 [facility] results please send / release to My Chart for patient view

## 2019-10-01 ENCOUNTER — Inpatient Hospital Stay: Admit: 2019-10-01 | Payer: BC Managed Care – PPO

## 2019-10-01 ENCOUNTER — Other Ambulatory Visit: Payer: BC Managed Care – PPO

## 2019-10-01 ENCOUNTER — Other Ambulatory Visit: Payer: Self-pay

## 2019-10-01 ENCOUNTER — Other Ambulatory Visit
Admission: RE | Admit: 2019-10-01 | Discharge: 2019-10-01 | Disposition: A | Discharge status: Home / Self Care | Payer: BC Managed Care – PPO | Source: Ambulatory Visit | Attending: Internal Medicine | Admitting: Internal Medicine

## 2019-10-01 DIAGNOSIS — E782 Mixed hyperlipidemia: Secondary | ICD-10-CM | POA: Diagnosis present

## 2019-10-01 DIAGNOSIS — R7989 Other specified abnormal findings of blood chemistry: Secondary | ICD-10-CM | POA: Diagnosis not present

## 2019-10-01 LAB — CBC WITH DIFFERENTIAL/PLATELET
Abs Immature Granulocytes: 0.02 10*3/uL (ref 0.00–0.07)
Basophils Absolute: 0.1 10*3/uL (ref 0.0–0.1)
Basophils Relative: 1 %
Eosinophils Absolute: 0 10*3/uL (ref 0.0–0.5)
Eosinophils Relative: 1 %
HCT: 41.2 % (ref 36.0–46.0)
Hemoglobin: 13.8 g/dL (ref 12.0–15.0)
Immature Granulocytes: 0 %
Lymphocytes Relative: 27 %
Lymphs Abs: 2.2 10*3/uL (ref 0.7–4.0)
MCH: 30.7 pg (ref 26.0–34.0)
MCHC: 33.5 g/dL (ref 30.0–36.0)
MCV: 91.6 fL (ref 80.0–100.0)
Monocytes Absolute: 0.5 10*3/uL (ref 0.1–1.0)
Monocytes Relative: 6 %
Neutro Abs: 5.3 10*3/uL (ref 1.7–7.7)
Neutrophils Relative %: 65 %
Platelets: 295 10*3/uL (ref 150–400)
RBC: 4.5 MIL/uL (ref 3.87–5.11)
RDW: 12.9 % (ref 11.5–15.5)
WBC: 8.1 10*3/uL (ref 4.0–10.5)
nRBC: 0 % (ref 0.0–0.2)

## 2019-10-01 LAB — IRON AND TIBC
Iron: 62 ug/dL (ref 28–170)
Saturation Ratios: 22 % (ref 10.4–31.8)
TIBC: 289 ug/dL (ref 250–450)
UIBC: 227 ug/dL

## 2019-10-01 LAB — HM MAMMOGRAPHY

## 2019-10-01 LAB — LIPID PANEL
Cholesterol: 178 mg/dL (ref 0–200)
HDL: 61 mg/dL (ref >40)
LDL Cholesterol: 108 mg/dL — ABNORMAL HIGH (ref 0–99)
Total CHOL/HDL Ratio: 2.9 RATIO
Triglycerides: 47 mg/dL (ref <150)
VLDL: 9 mg/dL (ref 0–40)

## 2019-10-01 LAB — FERRITIN: Ferritin: 144 ng/mL (ref 11–307)

## 2019-10-01 NOTE — Addendum Note (Signed)
Addended by: Shenaya Lebo on: 10/01/2019 10:44 AM   Modules accepted: Orders  

## 2019-10-01 NOTE — Addendum Note (Signed)
Addended by: Santiago Bur on: 10/01/2019 10:44 AM   Modules accepted: Orders

## 2019-10-01 NOTE — Addendum Note (Signed)
Addended by: Elpidio Galea T on: 10/01/2019 10:02 AM   Modules accepted: Orders

## 2019-10-02 ENCOUNTER — Ambulatory Visit (INDEPENDENT_AMBULATORY_CARE_PROVIDER_SITE_OTHER): Payer: BC Managed Care – PPO | Admitting: Internal Medicine

## 2019-10-02 ENCOUNTER — Encounter: Payer: Self-pay | Admitting: Internal Medicine

## 2019-10-02 DIAGNOSIS — F909 Attention-deficit hyperactivity disorder, unspecified type: Secondary | ICD-10-CM | POA: Diagnosis not present

## 2019-10-02 DIAGNOSIS — Z0000 Encounter for general adult medical examination without abnormal findings: Secondary | ICD-10-CM | POA: Diagnosis not present

## 2019-10-02 DIAGNOSIS — N62 Hypertrophy of breast: Secondary | ICD-10-CM | POA: Diagnosis not present

## 2019-10-02 DIAGNOSIS — E669 Obesity, unspecified: Secondary | ICD-10-CM | POA: Diagnosis not present

## 2019-10-02 DIAGNOSIS — Z9884 Bariatric surgery status: Secondary | ICD-10-CM | POA: Diagnosis not present

## 2019-10-02 MED ORDER — LISDEXAMFETAMINE DIMESYLATE 10 MG PO CAPS: 10.0000 mg | ORAL_CAPSULE | Freq: Every day | ORAL | 0 refills | Status: DC

## 2019-10-02 MED ORDER — NALTREXONE-BUPROPION HCL ER 8-90 MG PO TB12: ORAL_TABLET | ORAL | 0 refills | Status: DC

## 2019-10-02 NOTE — Assessment & Plan Note (Signed)
Managed with vyvanse  After treatment failure with adderall.  No medication changes  Refill for 6 months.

## 2019-10-02 NOTE — Progress Notes (Signed)
Virtual Visit via [email or web address]  This visit type was conducted due to national recommendations for restrictions regarding the COVID-19 pandemic (e.g. social distancing).  This format is felt to be most appropriate for this patient at this time.  All issues noted in this document were discussed and addressed.  No physical exam was performed (except for noted visual exam findings with Video Visits).   I connected with@ on 10/02/19 at  3:30 PM EST by a video enabled telemedicine application or telephone and verified that I am speaking with the correct person using two identifiers. Location patient: home Location provider: work or home office Persons participating in the virtual visit: patient, provider  I discussed the limitations, risks, security and privacy concerns of performing an evaluation and management service by telephone and the availability of in person appointments. I also discussed with the patient that there may be a patient responsible charge related to this service. The patient expressed understanding and agreed to proceed.  Reason for visit: CPE   HPI:  48 yr old female here for virtual CPE  Gastric sleeve in 2016 weight was 2018 , regained weight ,  Now with  25 lb weight  loss since Oct 2019. Gave up wine 4 months ago.  Wants to start taking Contrave to rech goal of 150-160 lbs.  Already taking wellbutrin 300 mg daily  Still having neck pain and back pain on a daily basis due to macromastia.  Seeing a chiropractor weekly.  Also treating recurrent candidal infections under breasts    Mammogram normal yesterday at [facility]/BS[facility] Deferring screening colonoscopy   ROS: Patient denies headache, fevers, malaise, unintentional weight loss, skin rash, eye pain, sinus congestion and sinus pain, sore throat, dysphagia,  hemoptysis , cough, dyspnea, wheezing, chest pain, palpitations, orthopnea, edema, abdominal pain, nausea, melena, diarrhea, constipation, flank pain, dysuria, hematuria,  urinary  Frequency, nocturia, numbness, tingling, seizures,  Focal weakness, Loss of consciousness,  Tremor, insomnia, depression, anxiety, and suicidal ideation.      Past Medical History:  Diagnosis Date  . 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 Migraine syndrome    once a month    Past Surgical History:  Procedure Laterality Date  . ABDOMINAL HYSTERECTOMY  jan 2012   endometriosis, inflammation, failed ablation  . CESAREAN SECTION    . LAPAROSCOPIC GASTRIC SLEEVE RESECTION  2016   [facility]  . LIPOMA RESECTION     left axilla, [facility]    Family History  Problem Relation Age of Onset  . Hypothyroidism Maternal Aunt   . Hypothyroidism Maternal Grandmother   . Cancer Neg Hx 39       thyroid ca  . Hypothyroidism Mother     SOCIAL HX:  reports that she has never smoked. She has never used smokeless tobacco. She reports current alcohol use. She reports that she does not use drugs.   Current Outpatient Medications:  .  ALPRAZolam (XANAX) 0.5 MG tablet, TAKE 1 TABLET BY MOUTH AT BEDTIME AS NEEDED, Disp: 30 tablet, Rfl: 5 .  buPROPion (WELLBUTRIN XL) 300 MG 24 hr tablet, Take 1 tablet (300 mg total) by mouth daily., Disp: 90 tablet, Rfl: 0 .  cyclobenzaprine (FLEXERIL) 5 MG tablet, TAKE 1 TABLET BY MOUTH THREE TIMES A DAY AS NEEDED FOR MUSCLE SPASMS, Disp: 90 tablet, Rfl: 1 .  HYDROcodone-acetaminophen (NORCO/VICODIN) 5-325 MG tablet, Take 1 tablet by mouth every 6 (six) hours as needed., Disp: 30 tablet,  Rfl: 0 .  lisdexamfetamine (VYVANSE) 10 MG capsule, Take 1 capsule (10 mg total) by mouth daily., Disp: 30 capsule, Rfl: 0 .  lisdexamfetamine (VYVANSE) 10 MG capsule, Take 1 capsule (10 mg total) by mouth daily., Disp: 30 capsule, Rfl: 0 .  metroNIDAZOLE (METROGEL) 1 % gel, Apply 1 application topically daily., Disp: 45 g, Rfl: 1 .  nystatin (MYCOSTATIN/NYSTOP) powder, Apply topically 2 (two) times daily. To rash until  resolved., Disp: 15 g, Rfl: 3 .  rizatriptan (MAXALT) 10 MG tablet, TAKE 1 TABLET BY MOUTH AS NEEDED MAY REPEAT IN 2 HOURS IF NEEDED, Disp: 10 tablet, Rfl: 2 .  Vitamin D, Ergocalciferol, (DRISDOL) 1.25 MG (50000 UT) CAPS capsule, TAKE 1 CAPSULE BY MOUTH ONE TIME PER WEEK, Disp: 12 capsule, Rfl: 0 .  [START ON 12/25/2019] lisdexamfetamine (VYVANSE) 10 MG capsule, Take 1 capsule (10 mg total) by mouth daily., Disp: 30 capsule, Rfl: 0 .  [START ON 11/26/2019] lisdexamfetamine (VYVANSE) 10 MG capsule, Take 1 capsule (10 mg total) by mouth daily., Disp: 30 capsule, Rfl: 0 .  [START ON 10/27/2019] lisdexamfetamine (VYVANSE) 10 MG capsule, Take 1 capsule (10 mg total) by mouth daily., Disp: 30 capsule, Rfl: 0 .  Naltrexone-buPROPion HCl ER 8-90 MG TB12, Start 1 tablet every morning for 7 days, then 1 tablet twice daily for 7 days, then 2 tablets every morning and one in the evening, Disp: 120 tablet, Rfl: 0  EXAM:  VITALS per patient if applicable:  GENERAL: alert, oriented, appears well and in no acute distress  HEENT: atraumatic, conjunttiva clear, no obvious abnormalities on inspection of external nose and ears  NECK: normal movements of the head and neck  LUNGS: on inspection no signs of respiratory distress, breathing rate appears normal, no obvious gross SOB, gasping or wheezing  CV: no obvious cyanosis  MS: moves all visible extremities without noticeable abnormality  PSYCH/NEURO: pleasant and cooperative, no obvious depression or anxiety, speech and thought processing grossly intact  ASSESSMENT AND PLAN:  Discussed the following assessment and plan:  S/P bariatric surgery  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  No medication changes  Refill for 6 months.    Macromastia She is a candidate for breast reduction surgery due to recurrent rash under breasts, and  persistent back and neck pain .  Obesity (BMI 30-39.9) She has lost 10 lbs through dietary restrictions including alcohol abstinence .  Adding contrave per patient  request   Encounter for preventive health examination age appropriate education and counseling updated, referrals for preventative services and immunizations addressed, dietary and smoking counseling addressed, most recent labs reviewed.  I have personally reviewed and have noted:  1) the patient's medical and social history 2) The pt's use of alcohol, tobacco, and illicit drugs 3) The patient's current medications and supplements 4) Functional ability including ADL's, fall risk, home safety risk, hearing and visual impairment 5) Diet and physical activities 6) Evidence for depression or mood disorder 7) The patient's height, weight, and BMI have been recorded in the chart  I have made referrals, and provided counseling and education based on review of the above    I discussed the assessment and treatment plan with the patient. The patient was provided an opportunity to ask questions and all were answered. The patient agreed with the plan and demonstrated an  understanding of the instructions.   The patient was advised to call back or seek an in-person evaluation if the symptoms worsen or if the condition fails to improve as anticipated.   Crecencio Mc, MD

## 2019-10-02 NOTE — Assessment & Plan Note (Signed)

## 2019-10-02 NOTE — Assessment & Plan Note (Signed)
She is a candidate for breast reduction surgery due to recurrent rash under breasts, and  persistent back and neck pain .

## 2019-10-02 NOTE — Assessment & Plan Note (Signed)
She has lost 10 lbs through dietary restrictions including alcohol abstinence .  Adding contrave per patient  request

## 2019-10-04 ENCOUNTER — Encounter: Payer: Self-pay | Admitting: Internal Medicine

## 2019-10-27 ENCOUNTER — Other Ambulatory Visit: Payer: Self-pay | Admitting: Internal Medicine

## 2019-10-27 DIAGNOSIS — E559 Vitamin D deficiency, unspecified: Secondary | ICD-10-CM

## 2019-12-30 ENCOUNTER — Other Ambulatory Visit: Payer: Self-pay | Admitting: Internal Medicine

## 2020-01-28 ENCOUNTER — Other Ambulatory Visit: Payer: Self-pay | Admitting: Internal Medicine

## 2020-01-28 DIAGNOSIS — E559 Vitamin D deficiency, unspecified: Secondary | ICD-10-CM

## 2020-01-28 NOTE — Telephone Encounter (Signed)
Refill request for Vitamin D, last seen 10-02-2019, last filled 10-30-19.  Please advise. Vitamin d resulted-21 on 05-01-2019

## 2020-03-31 ENCOUNTER — Other Ambulatory Visit: Payer: Self-pay | Admitting: Internal Medicine

## 2020-04-30 ENCOUNTER — Telehealth: Payer: Self-pay | Admitting: Internal Medicine

## 2020-04-30 MED ORDER — LISDEXAMFETAMINE DIMESYLATE 10 MG PO CAPS: 10.0000 mg | ORAL_CAPSULE | Freq: Every day | ORAL | 0 refills | Status: DC

## 2020-04-30 NOTE — Addendum Note (Signed)
Addended by: Sherlene Shams on: 04/30/2020 01:24 PM   Modules accepted: Orders

## 2020-04-30 NOTE — Telephone Encounter (Signed)
Pt needs a refill on lisdexamfetamine (VYVANSE) [facility]

## 2020-06-25 ENCOUNTER — Other Ambulatory Visit: Payer: Self-pay | Admitting: Internal Medicine

## 2020-08-12 ENCOUNTER — Telehealth: Payer: Self-pay

## 2020-08-12 MED ORDER — LISDEXAMFETAMINE DIMESYLATE 10 MG PO CAPS: 10.0000 mg | ORAL_CAPSULE | Freq: Every day | ORAL | 0 refills | Status: DC

## 2020-08-12 NOTE — Telephone Encounter (Signed)
Pt needs [facility]

## 2020-08-12 NOTE — Telephone Encounter (Signed)
LS: 04-30-2020 LO:05-31-20

## 2020-09-16 ENCOUNTER — Other Ambulatory Visit: Payer: Self-pay | Admitting: Internal Medicine

## 2020-09-16 DIAGNOSIS — E559 Vitamin D deficiency, unspecified: Secondary | ICD-10-CM

## 2020-09-17 ENCOUNTER — Other Ambulatory Visit: Payer: Self-pay | Admitting: Internal Medicine

## 2020-10-03 ENCOUNTER — Ambulatory Visit (INDEPENDENT_AMBULATORY_CARE_PROVIDER_SITE_OTHER): Payer: BC Managed Care – PPO | Admitting: Internal Medicine

## 2020-10-03 ENCOUNTER — Encounter: Payer: Self-pay | Admitting: Internal Medicine

## 2020-10-03 ENCOUNTER — Other Ambulatory Visit: Payer: Self-pay

## 2020-10-03 VITALS — BP 122/78 | HR 78 | Temp 98.1°F | Ht 64.25 in | Wt 187.4 lb

## 2020-10-03 DIAGNOSIS — E669 Obesity, unspecified: Secondary | ICD-10-CM

## 2020-10-03 DIAGNOSIS — Z9884 Bariatric surgery status: Secondary | ICD-10-CM

## 2020-10-03 DIAGNOSIS — E559 Vitamin D deficiency, unspecified: Secondary | ICD-10-CM

## 2020-10-03 DIAGNOSIS — Z6833 Body mass index (BMI) 33.0-33.9, adult: Secondary | ICD-10-CM

## 2020-10-03 DIAGNOSIS — F909 Attention-deficit hyperactivity disorder, unspecified type: Secondary | ICD-10-CM

## 2020-10-03 DIAGNOSIS — Z1231 Encounter for screening mammogram for malignant neoplasm of breast: Secondary | ICD-10-CM | POA: Diagnosis not present

## 2020-10-03 DIAGNOSIS — Z0000 Encounter for general adult medical examination without abnormal findings: Secondary | ICD-10-CM

## 2020-10-03 DIAGNOSIS — Z1211 Encounter for screening for malignant neoplasm of colon: Secondary | ICD-10-CM

## 2020-10-03 DIAGNOSIS — Z90710 Acquired absence of both cervix and uterus: Secondary | ICD-10-CM

## 2020-10-03 MED ORDER — HYDROCODONE-ACETAMINOPHEN 5-325 MG PO TABS: 1.0000 | ORAL_TABLET | Freq: Four times a day (QID) | ORAL | 0 refills | Status: DC | PRN

## 2020-10-03 MED ORDER — LISDEXAMFETAMINE DIMESYLATE 20 MG PO CAPS: 20.0000 mg | ORAL_CAPSULE | Freq: Every day | ORAL | 0 refills | Status: DC

## 2020-10-03 NOTE — Progress Notes (Signed)
Patient ID: Candice Vaughn, female    DOB: 1971-02-12  Age: 49 y.o. MRN: [id number]  The patient is here for annual examination and management of other chronic and acute problems.  This visit occurred during the SARS-CoV-2 public health emergency.  Safety protocols were in place, including screening questions prior to the visit, additional usage of staff PPE, and extensive cleaning of exam room while observing appropriate contact time as indicated for disinfecting solutions.     The risk factors are reflected in the social history.  There are no preventive care reminders to display for this patient.    The roster of all physicians providing medical care to patient - is listed in the Snapshot section of the chart.  Activities of daily living:  The patient is 100% independent in all ADLs: dressing, toileting, feeding as well as independent mobility  Home safety : The patient has smoke detectors in the home. They wear seatbelts.  There are no firearms at home. There is no violence in the home.   There is no risks for hepatitis, STDs or HIV. There is no   history of blood transfusion. They have no travel history to infectious disease endemic areas of the world.  The patient has seen their dentist in the last six month. They have seen their eye doctor in the last year. They admit to slight hearing difficulty with regard to whispered voices and some television programs.  They have deferred audiologic testing in the last year.  They do not  have excessive sun exposure. Discussed the need for sun protection: hats, long sleeves and use of sunscreen if there is significant sun exposure.   Diet: the importance of a healthy diet is discussed. They do have a healthy diet.  The benefits of regular aerobic exercise were discussed. She walks 4 times per week ,  20 minutes.   Depression screen: there are no signs or vegative symptoms of depression- irritability, change in appetite, anhedonia,  sadness/tearfullness.  Cognitive assessment: the patient manages all their financial and personal affairs and is actively engaged. They could relate day,date,year and events; recalled 2/3 objects at 3 minutes; performed clock-face test normally.  The following portions of the patient's history were reviewed and updated as appropriate: allergies, current medications, past family history, past medical history,  past surgical history, past social history  and problem list.  Visual acuity was not assessed per patient preference since she has regular follow up with her ophthalmologist. Hearing and body mass index were assessed and reviewed.   During the course of the visit the patient was educated and counseled about appropriate screening and preventive services including : fall prevention , diabetes screening, nutrition counseling, colorectal cancer screening, and recommended immunizations.    CC: The primary encounter diagnosis was Vitamin D deficiency. 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  S/p sleeve gastrectomy 2016.  Seeing Dr Benjamine Mola at [facility]"  To maintain her weight:   Now taking wellbutrin 300 XL and naltrexone 50 mg daily instead of Contrave which was not covered by insurance.   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   She has a past surgical history that includes Cesarean section; Abdominal hysterectomy (jan 2012); Lipoma resection; and  Laparoscopic gastric sleeve resection (2016).   Her family history includes Hypothyroidism in her maternal aunt, maternal grandmother, and mother.She reports that she has never smoked. She has never used smokeless tobacco. She reports current alcohol use. She reports that she does not use drugs.  Outpatient Medications Prior to Visit  Medication Sig Dispense Refill  . ALPRAZolam (XANAX) 0.5 MG tablet TAKE 1 TABLET BY MOUTH AT BEDTIME AS NEEDED 30 tablet 5  . buPROPion (WELLBUTRIN XL) 300 MG 24 hr tablet TAKE 1 TABLET BY MOUTH EVERY DAY 90 tablet 0  . cyclobenzaprine (FLEXERIL) 5 MG tablet TAKE 1 TABLET BY MOUTH THREE TIMES A DAY AS NEEDED FOR MUSCLE SPASMS 90 tablet 1  . lisdexamfetamine (VYVANSE) 10 MG capsule Take 1 capsule (10 mg total) by mouth daily. 30 capsule 0  . lisdexamfetamine (VYVANSE) 10 MG capsule Take 1 capsule (10 mg total) by mouth daily. 30 capsule 0  . lisdexamfetamine (VYVANSE) 10 MG capsule Take 1 capsule (10 mg total) by mouth daily. 30 capsule 0  . lisdexamfetamine (VYVANSE) 10 MG capsule Take 1 capsule (10 mg total) by mouth daily. 30 capsule 0  . naltrexone (DEPADE) 50 MG tablet Take 1 tablet by mouth daily.    Marland Kitchen. nystatin (MYCOSTATIN/NYSTOP) powder Apply topically 2 (two) times daily. To rash until resolved. 15 g 3  . rizatriptan (MAXALT) 10 MG tablet TAKE 1 TABLET BY MOUTH AS NEEDED MAY REPEAT IN 2 HOURS IF NEEDED 10 tablet 2  . valACYclovir (VALTREX) 500 MG tablet Take 500 mg by mouth 2 (two) times daily.    . Vitamin D, Ergocalciferol, (DRISDOL) 1.25 MG (50000 UNIT) CAPS capsule TAKE 1 CAPSULE BY MOUTH ONE TIME PER WEEK 12 capsule 0  . HYDROcodone-acetaminophen (NORCO/VICODIN) 5-325 MG tablet Take 1 tablet by mouth every 6 (six) hours as needed. 30 tablet 0  . lisdexamfetamine (VYVANSE) 10 MG capsule Take 1 capsule (10 mg total) by mouth daily. 30 capsule 0  . metroNIDAZOLE (METROGEL) 1 % gel Apply 1 application topically daily. (Patient not taking: Reported on  10/03/2020) 45 g 1  . Naltrexone-buPROPion HCl ER 8-90 MG TB12 Start 1 tablet every morning for 7 days, then 1 tablet twice daily for 7 days, then 2 tablets every morning and one in the evening (Patient not taking: Reported on 10/03/2020) 120 tablet 0   No facility-administered medications prior to visit.    Review of Systems   Patient denies headache, fevers, malaise, unintentional weight loss, skin rash, eye pain, sinus congestion and sinus pain, sore throat, dysphagia,  hemoptysis , cough, dyspnea, wheezing, chest pain, palpitations, orthopnea, edema, abdominal pain, nausea, melena, diarrhea, constipation, flank pain, dysuria, hematuria, urinary  Frequency, nocturia, numbness, tingling, seizures,  Focal weakness, Loss of consciousness,  Tremor, insomnia, depression, anxiety, and suicidal ideation.     Objective:  BP 122/78   Pulse 78   Temp 98.1 F (36.7 C)   Ht 5' 4.25" (1.632 m)   Wt 187 lb 6.4 oz (85 kg)   SpO2 98%   BMI 31.92 kg/m   Physical Exam  General appearance: alert, cooperative and appears stated age Head: Normocephalic, without obvious abnormality, atraumatic Eyes: conjunctivae/corneas clear. PERRL, EOM's intact. Fundi benign. Ears: normal TM's and external ear canals both ears Nose: Nares normal. Septum midline. Mucosa normal. No drainage or sinus  tenderness. Throat: lips, mucosa, and tongue normal; teeth and gums normal Neck: no adenopathy, no carotid bruit, no JVD, supple, symmetrical, trachea midline and thyroid not enlarged, symmetric, no tenderness/mass/nodules Lungs: clear to auscultation bilaterally Breasts: normal appearance, no masses or tenderness Heart: regular rate and rhythm, S1, S2 normal, no murmur, click, rub or gallop Abdomen: soft, non-tender; bowel sounds normal; no masses,  no organomegaly Extremities: extremities normal, atraumatic, no cyanosis or edema Pulses: 2+ and symmetric Skin: Skin color, texture, turgor normal. No rashes or  lesions Neurologic: Alert and oriented X 3, normal strength and tone. Normal symmetric reflexes. Normal coordination and gait.    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  May be side effect of naltrexone.       Obesity (BMI 30-39.9)    Now managed with Wellburin XL 300 mg and naltrexone 50 mg daily by clinician at [facility]       Relevant Medications   lisdexamfetamine (VYVANSE) 20 MG capsule   Encounter for preventive health examination    age appropriate education and counseling updated, referrals for preventative services and immunizations addressed, dietary and smoking counseling addressed, most recent labs reviewed.  I have personally reviewed and have noted:  1) the patient's medical and social history 2) The pt's use of alcohol, tobacco, and illicit drugs 3) The patient's current medications and supplements 4) Functional ability including ADL's, fall risk, home safety risk, hearing and visual impairment 5) Diet and physical activities 6) Evidence for depression or mood disorder 7) The patient's height, weight, and BMI have been recorded in the chart  I have made referrals, and provided counseling and education based on review of the above      S/P total hysterectomy   Vitamin D deficiency - Primary    Persistent due to bariatric surgery. continue periodic checks and  Supplementation .      Relevant Orders   VITAMIN D 25 Hydroxy (Vit-D Deficiency, Fractures) (Completed)   S/P bariatric surgery   Relevant Orders   CBC with Differential/Platelet (Completed)   Iron, TIBC and Ferritin Panel (Completed)   Vitamin B12    Other Visit Diagnoses    Class 1 obesity without serious comorbidity with body mass index (BMI) of 33.0 to 33.9 in adult, unspecified obesity type       Relevant Medications    lisdexamfetamine (VYVANSE) 20 MG capsule   Other Relevant Orders   Lipid panel (Completed)   Comprehensive metabolic panel (Completed)   TSH (Completed)   Hemoglobin A1c (Completed)   Encounter for screening mammogram for malignant neoplasm of breast       Relevant Orders   MM 3D SCREEN BREAST BILATERAL   Colon cancer screening       Relevant Orders   Ambulatory referral to Gastroenterology      I have discontinued Adele Dan Naltrexone-buPROPion HCl ER. I have also changed her lisdexamfetamine. Additionally, I am having her maintain her metroNIDAZOLE, nystatin, lisdexamfetamine, ALPRAZolam, rizatriptan, lisdexamfetamine, lisdexamfetamine, lisdexamfetamine, cyclobenzaprine, Vitamin D (Ergocalciferol), buPROPion, naltrexone, valACYclovir, and HYDROcodone-acetaminophen.  Meds ordered this encounter  Medications  . lisdexamfetamine (VYVANSE) 20 MG capsule    Sig: Take 1 capsule (20 mg total) by mouth daily.    Dispense:  30 capsule    Refill:  0  . HYDROcodone-acetaminophen (NORCO/VICODIN) 5-325 MG  tablet    Sig: Take 1 tablet by mouth every 6 (six) hours as needed.    Dispense:  30 tablet    Refill:  0    Medications Discontinued During This Encounter  Medication Reason  . Naltrexone-buPROPion HCl ER 8-90 MG TB12 Change in therapy  . lisdexamfetamine (VYVANSE) 10 MG capsule   . HYDROcodone-acetaminophen (NORCO/VICODIN) 5-325 MG tablet Reorder    Follow-up: No follow-ups on file.   Sherlene Shams, MD

## 2020-10-03 NOTE — Patient Instructions (Addendum)
I have increased the Vyvanse to 20 mg daily as a trial for  30 days.  Let me know if this is tolerated.   Referral to [facility] for colonoscopy in process  Mammogram ordered   Health Maintenance, Female Adopting a healthy lifestyle and getting preventive care are important in promoting health and wellness. Ask your health care provider about:  The right schedule for you to have regular tests and exams.  Things you can do on your own to prevent diseases and keep yourself healthy. What should I know about diet, weight, and exercise? Eat a healthy diet   Eat a diet that includes plenty of vegetables, fruits, low-fat dairy products, and lean protein.  Do not eat a lot of foods that are high in solid fats, added sugars, or sodium. Maintain a healthy weight Body mass index (BMI) is used to identify weight problems. It estimates body fat based on height and weight. Your health care provider can help determine your BMI and help you achieve or maintain a healthy weight. Get regular exercise Get regular exercise. This is one of the most important things you can do for your health. Most adults should:  Exercise for at least 150 minutes each week. The exercise should increase your heart rate and make you sweat (moderate-intensity exercise).  Do strengthening exercises at least twice a week. This is in addition to the moderate-intensity exercise.  Spend less time sitting. Even light physical activity can be beneficial. Watch cholesterol and blood lipids Have your blood tested for lipids and cholesterol at 48 years of age, then have this test every 5 years. Have your cholesterol levels checked more often if:  Your lipid or cholesterol levels are high.  You are older than 49 years of age.  You are at high risk for heart disease. What should I know about cancer screening? Depending on your health history and family history, you may need to have cancer screening at various ages. This may  include screening for:  Breast cancer.  Cervical cancer.  Colorectal cancer.  Skin cancer.  Lung cancer. What should I know about heart disease, diabetes, and high blood pressure? Blood pressure and heart disease  High blood pressure causes heart disease and increases the risk of stroke. This is more likely to develop in people who have high blood pressure readings, are of African descent, or are overweight.  Have your blood pressure checked: ? Every 3-5 years if you are 62-49 years of age. ? Every year if you are 76 years old or older. Diabetes Have regular diabetes screenings. This checks your fasting blood sugar level. Have the screening done:  Once every three years after age 79 if you are at a normal weight and have a low risk for diabetes.  More often and at a younger age if you are overweight or have a high risk for diabetes. What should I know about preventing infection? Hepatitis B If you have a higher risk for hepatitis B, you should be screened for this virus. Talk with your health care provider to find out if you are at risk for hepatitis B infection. Hepatitis C Testing is recommended for:  Everyone born from [birth year] through 1964.  Anyone with known risk factors for hepatitis C. Sexually transmitted infections (STIs)  Get screened for STIs, including gonorrhea and chlamydia, if: ? You are sexually active and are younger than 48 years of age. ? You are older than 48 years of age and your health care provider  tells you that you are at risk for this type of infection. ? Your sexual activity has changed since you were last screened, and you are at increased risk for chlamydia or gonorrhea. Ask your health care provider if you are at risk.  Ask your health care provider about whether you are at high risk for HIV. Your health care provider may recommend a prescription medicine to help prevent HIV infection. If you choose to take medicine to prevent HIV, you should first  get tested for HIV. You should then be tested every 3 months for as long as you are taking the medicine. Pregnancy  If you are about to stop having your period (premenopausal) and you may become pregnant, seek counseling before you get pregnant.  Take 400 to 800 micrograms (mcg) of folic acid every day if you become pregnant.  Ask for birth control (contraception) if you want to prevent pregnancy. Osteoporosis and menopause Osteoporosis is a disease in which the bones lose minerals and strength with aging. This can result in bone fractures. If you are 75 years old or older, or if you are at risk for osteoporosis and fractures, ask your health care provider if you should:  Be screened for bone loss.  Take a calcium or vitamin D supplement to lower your risk of fractures.  Be given hormone replacement therapy (HRT) to treat symptoms of menopause. Follow these instructions at home: Lifestyle  Do not use any products that contain nicotine or tobacco, such as cigarettes, e-cigarettes, and chewing tobacco. If you need help quitting, ask your health care provider.  Do not use street drugs.  Do not share needles.  Ask your health care provider for help if you need support or information about quitting drugs. Alcohol use  Do not drink alcohol if: ? Your health care provider tells you not to drink. ? You are pregnant, may be pregnant, or are planning to become pregnant.  If you drink alcohol: ? Limit how much you use to 0-1 drink a day. ? Limit intake if you are breastfeeding.  Be aware of how much alcohol is in your drink. In the [place] one drink equals one 12 oz bottle of beer (355 mL), one 5 oz glass of wine (148 mL), or one 1 oz glass of hard liquor (44 mL). General instructions  Schedule regular health, dental, and eye exams.  Stay current with your vaccines.  Tell your health care provider if: ? You often feel depressed. ? You have ever been abused or do not feel safe at  home. Summary  Adopting a healthy lifestyle and getting preventive care are important in promoting health and wellness.  Follow your health care provider's instructions about healthy diet, exercising, and getting tested or screened for diseases.  Follow your health care provider's instructions on monitoring your cholesterol and blood pressure. This information is not intended to replace advice given to you by your health care provider. Make sure you discuss any questions you have with your health care provider. Document Revised: 10/04/2018 Document Reviewed: 10/04/2018 Elsevier Patient Education  2020 [employer].

## 2020-10-03 NOTE — Assessment & Plan Note (Signed)
Requesting increase in dose of Vyvanse to 20 mg daily due to inattention.  May be side effect of naltrexone.

## 2020-10-03 NOTE — Assessment & Plan Note (Signed)
Now managed with Wellburin XL 300 mg and naltrexone 50 mg daily by clinician at [facility]

## 2020-10-03 NOTE — Assessment & Plan Note (Signed)

## 2020-10-04 DIAGNOSIS — Z9884 Bariatric surgery status: Secondary | ICD-10-CM

## 2020-10-04 LAB — CBC WITH DIFFERENTIAL/PLATELET
Absolute Monocytes: 570 cells/uL (ref 200–950)
Basophils Absolute: 46 cells/uL (ref 0–200)
Basophils Relative: 0.5 %
Eosinophils Absolute: 74 cells/uL (ref 15–500)
Eosinophils Relative: 0.8 %
HCT: 42 % (ref 35.0–45.0)
Hemoglobin: 14.3 g/dL (ref 11.7–15.5)
Lymphs Abs: 2098 cells/uL (ref 850–3900)
MCH: 31.8 pg (ref 27.0–33.0)
MCHC: 34 g/dL (ref 32.0–36.0)
MCV: 93.5 fL (ref 80.0–100.0)
MPV: 10.6 fL (ref 7.5–12.5)
Monocytes Relative: 6.2 %
Neutro Abs: 6412 cells/uL (ref 1500–7800)
Neutrophils Relative %: 69.7 %
Platelets: 345 10*3/uL (ref 140–400)
RBC: 4.49 10*6/uL (ref 3.80–5.10)
RDW: 12.3 % (ref 11.0–15.0)
Total Lymphocyte: 22.8 %
WBC: 9.2 10*3/uL (ref 3.8–10.8)

## 2020-10-04 LAB — HEMOGLOBIN A1C
Hgb A1c MFr Bld: 5 % of total Hgb (ref <5.7)
Mean Plasma Glucose: 97 mg/dL
eAG (mmol/L): 5.4 mmol/L

## 2020-10-04 LAB — COMPREHENSIVE METABOLIC PANEL
AG Ratio: 1.6 (calc) (ref 1.0–2.5)
ALT: 20 U/L (ref 6–29)
AST: 19 U/L (ref 10–35)
Albumin: 4.5 g/dL (ref 3.6–5.1)
Alkaline phosphatase (APISO): 29 U/L — ABNORMAL LOW (ref 31–125)
BUN: 21 mg/dL (ref 7–25)
CO2: 27 mmol/L (ref 20–32)
Calcium: 9.8 mg/dL (ref 8.6–10.2)
Chloride: 101 mmol/L (ref 98–110)
Creat: 0.95 mg/dL (ref 0.50–1.10)
Globulin: 2.9 g/dL (calc) (ref 1.9–3.7)
Glucose, Bld: 82 mg/dL (ref 65–99)
Potassium: 4.3 mmol/L (ref 3.5–5.3)
Sodium: 140 mmol/L (ref 135–146)
Total Bilirubin: 0.4 mg/dL (ref 0.2–1.2)
Total Protein: 7.4 g/dL (ref 6.1–8.1)

## 2020-10-04 LAB — SPECIMEN COMPROMISED

## 2020-10-04 LAB — LIPID PANEL
Cholesterol: 203 mg/dL — ABNORMAL HIGH (ref <200)
HDL: 90 mg/dL (ref >=50)
LDL Cholesterol (Calc): 97 mg/dL (calc)
Non-HDL Cholesterol (Calc): 113 mg/dL (calc) (ref <130)
Total CHOL/HDL Ratio: 2.3 (calc) (ref <5.0)
Triglycerides: 75 mg/dL (ref <150)

## 2020-10-04 LAB — TSH: TSH: 1.58 mIU/L

## 2020-10-04 LAB — VITAMIN D 25 HYDROXY (VIT D DEFICIENCY, FRACTURES): Vit D, 25-Hydroxy: 50 ng/mL (ref 30–100)

## 2020-10-04 LAB — IRON,TIBC AND FERRITIN PANEL
%SAT: 16 % (calc) (ref 16–45)
Ferritin: 103 ng/mL (ref 16–232)
Iron: 60 ug/dL (ref 40–190)
TIBC: 384 mcg/dL (calc) (ref 250–450)

## 2020-10-05 NOTE — Progress Notes (Signed)
All labs are normal thus far.  B12 is still pending .

## 2020-10-05 NOTE — Assessment & Plan Note (Signed)
Persistent due to bariatric surgery. continue periodic checks and  Supplementation .

## 2020-10-09 ENCOUNTER — Other Ambulatory Visit (INDEPENDENT_AMBULATORY_CARE_PROVIDER_SITE_OTHER): Payer: Self-pay

## 2020-10-09 ENCOUNTER — Telehealth (INDEPENDENT_AMBULATORY_CARE_PROVIDER_SITE_OTHER): Payer: Self-pay | Admitting: Gastroenterology

## 2020-10-09 DIAGNOSIS — Z1211 Encounter for screening for malignant neoplasm of colon: Secondary | ICD-10-CM

## 2020-10-09 MED ORDER — PEG 3350-KCL-NA BICARB-NACL 420 G PO SOLR: 4000.0000 mL | Freq: Once | ORAL | 0 refills | Status: AC

## 2020-10-09 NOTE — Progress Notes (Signed)
Gastroenterology Pre-Procedure Review  Request Date: 11/17/20 Requesting Physician: Dr. Maximino Greenland  PATIENT REVIEW QUESTIONS: The patient responded to the following health history questions as indicated:    1. Are you having any GI issues? no 2. Do you have a personal history of Polyps? no 3. Do you have a family history of Colon Cancer or Polyps? yes (grandmother colon cancer) 4. Diabetes Mellitus? no 5. Joint replacements in the past 12 months?no 6. Major health problems in the past 3 months?no 7. Any artificial heart valves, MVP, or defibrillator?no    MEDICATIONS & ALLERGIES:    Patient reports the following regarding taking any anticoagulation/antiplatelet therapy:   Plavix, Coumadin, Eliquis, Xarelto, Lovenox, Pradaxa, Brilinta, or Effient? no Aspirin? no  Patient confirms/reports the following medications:  Current Outpatient Medications  Medication Sig Dispense Refill  . ALPRAZolam (XANAX) 0.5 MG tablet TAKE 1 TABLET BY MOUTH AT BEDTIME AS NEEDED 30 tablet 5  . buPROPion (WELLBUTRIN XL) 300 MG 24 hr tablet TAKE 1 TABLET BY MOUTH EVERY DAY 90 tablet 0  . cyclobenzaprine (FLEXERIL) 5 MG tablet TAKE 1 TABLET BY MOUTH THREE TIMES A DAY AS NEEDED FOR MUSCLE SPASMS 90 tablet 1  . HYDROcodone-acetaminophen (NORCO/VICODIN) 5-325 MG tablet Take 1 tablet by mouth every 6 (six) hours as needed. 30 tablet 0  . lisdexamfetamine (VYVANSE) 10 MG capsule Take 1 capsule (10 mg total) by mouth daily. 30 capsule 0  . lisdexamfetamine (VYVANSE) 10 MG capsule Take 1 capsule (10 mg total) by mouth daily. 30 capsule 0  . lisdexamfetamine (VYVANSE) 10 MG capsule Take 1 capsule (10 mg total) by mouth daily. 30 capsule 0  . lisdexamfetamine (VYVANSE) 10 MG capsule Take 1 capsule (10 mg total) by mouth daily. 30 capsule 0  . lisdexamfetamine (VYVANSE) 20 MG capsule Take 1 capsule (20 mg total) by mouth daily. 30 capsule 0  . metroNIDAZOLE (METROGEL) 1 % gel Apply 1 application topically daily. 45 g 1  .  naltrexone (DEPADE) 50 MG tablet Take 1 tablet by mouth daily.    Marland Kitchen nystatin (MYCOSTATIN/NYSTOP) powder Apply topically 2 (two) times daily. To rash until resolved. 15 g 3  . rizatriptan (MAXALT) 10 MG tablet TAKE 1 TABLET BY MOUTH AS NEEDED MAY REPEAT IN 2 HOURS IF NEEDED 10 tablet 2  . valACYclovir (VALTREX) 500 MG tablet Take 500 mg by mouth 2 (two) times daily.    . Vitamin D, Ergocalciferol, (DRISDOL) 1.25 MG (50000 UNIT) CAPS capsule TAKE 1 CAPSULE BY MOUTH ONE TIME PER WEEK 12 capsule 0   No current facility-administered medications for this visit.    Patient confirms/reports the following allergies:  Allergies  Allergen Reactions  . Nsaids Other (See Comments)    No orders of the defined types were placed in this encounter.   AUTHORIZATION INFORMATION Primary Insurance: 1D#: Group #:  Secondary Insurance: 1D#: Group #:  SCHEDULE INFORMATION: Date: 11/17/20 Time: Location:[facility]

## 2020-10-31 ENCOUNTER — Telehealth: Payer: Self-pay

## 2020-10-31 NOTE — Telephone Encounter (Signed)
LVM for pt to call the office to reschedule her colonoscopy currently scheduled for 11/17/20 at [facility] with Dr. Karie Schwalbe due to change in schedule.  Dr. Karie Schwalbe will not be available on 11/17/20.  Thanks,  [place], [place]

## 2020-11-13 ENCOUNTER — Other Ambulatory Visit: Payer: Self-pay

## 2020-11-13 ENCOUNTER — Encounter: Payer: Self-pay | Admitting: Gastroenterology

## 2020-11-16 DIAGNOSIS — U071 COVID-19: Secondary | ICD-10-CM

## 2020-11-16 HISTORY — DX: COVID-19: U07.1

## 2020-11-18 ENCOUNTER — Other Ambulatory Visit: Payer: Self-pay | Admitting: Gastroenterology

## 2020-11-18 ENCOUNTER — Other Ambulatory Visit: Payer: Self-pay

## 2020-11-18 MED ORDER — SUTAB 1479-225-188 MG PO TABS: 376.0000 mg | ORAL_TABLET | ORAL | 0 refills | Status: DC

## 2020-11-20 ENCOUNTER — Other Ambulatory Visit: Payer: Self-pay

## 2020-11-20 MED ORDER — PEG 3350-KCL-NA BICARB-NACL 420 G PO SOLR: ORAL | 0 refills | Status: DC

## 2020-11-25 ENCOUNTER — Encounter: Payer: Self-pay | Admitting: Gastroenterology

## 2020-11-25 ENCOUNTER — Other Ambulatory Visit: Payer: Self-pay

## 2020-12-03 ENCOUNTER — Other Ambulatory Visit: Payer: Self-pay | Admitting: Internal Medicine

## 2020-12-03 DIAGNOSIS — E559 Vitamin D deficiency, unspecified: Secondary | ICD-10-CM

## 2020-12-04 ENCOUNTER — Other Ambulatory Visit: Payer: BC Managed Care – PPO | Attending: Gastroenterology

## 2020-12-12 ENCOUNTER — Telehealth: Payer: Self-pay | Admitting: Internal Medicine

## 2020-12-12 ENCOUNTER — Other Ambulatory Visit: Payer: Self-pay | Admitting: Internal Medicine

## 2020-12-12 MED ORDER — LISDEXAMFETAMINE DIMESYLATE 20 MG PO CAPS: 20.0000 mg | ORAL_CAPSULE | Freq: Every day | ORAL | 0 refills | Status: DC

## 2020-12-12 MED ORDER — LISDEXAMFETAMINE DIMESYLATE 20 MG PO CAPS
20.0000 mg | ORAL_CAPSULE | Freq: Every day | ORAL | 0 refills | Status: DC
Start: 2020-12-12 — End: 2021-04-07

## 2020-12-12 NOTE — Telephone Encounter (Signed)
Last OV 10/14/20 ok to fill?

## 2020-12-12 NOTE — Telephone Encounter (Signed)
Pt needs a refill on VYVANSE she said that she is now taking 20mg 

## 2020-12-12 NOTE — Addendum Note (Signed)
Addended by: Sherlene Shams on: 12/12/2020 12:43 PM   Modules accepted: Orders

## 2020-12-18 LAB — HM MAMMOGRAPHY

## 2020-12-25 ENCOUNTER — Other Ambulatory Visit: Payer: BC Managed Care – PPO

## 2020-12-26 ENCOUNTER — Encounter: Payer: Self-pay | Admitting: Internal Medicine

## 2020-12-26 NOTE — Discharge Instructions (Signed)

## 2020-12-29 ENCOUNTER — Other Ambulatory Visit: Payer: Self-pay

## 2020-12-29 ENCOUNTER — Ambulatory Visit: Payer: BC Managed Care – PPO | Admitting: Anesthesiology

## 2020-12-29 ENCOUNTER — Ambulatory Visit
Admission: RE | Admit: 2020-12-29 | Discharge: 2020-12-29 | Disposition: A | Discharge status: Home / Self Care | Payer: BC Managed Care – PPO | Attending: Gastroenterology | Admitting: Gastroenterology

## 2020-12-29 ENCOUNTER — Ambulatory Visit
Admission: RE | Disposition: A | Discharge status: Home / Self Care | Payer: Self-pay | Source: Home / Self Care | Attending: Gastroenterology

## 2020-12-29 ENCOUNTER — Encounter: Payer: Self-pay | Admitting: Gastroenterology

## 2020-12-29 DIAGNOSIS — Z1211 Encounter for screening for malignant neoplasm of colon: Secondary | ICD-10-CM | POA: Diagnosis present

## 2020-12-29 DIAGNOSIS — Z79899 Other long term (current) drug therapy: Secondary | ICD-10-CM | POA: Diagnosis not present

## 2020-12-29 DIAGNOSIS — Z8616 Personal history of COVID-19: Secondary | ICD-10-CM | POA: Insufficient documentation

## 2020-12-29 DIAGNOSIS — K6289 Other specified diseases of anus and rectum: Secondary | ICD-10-CM | POA: Insufficient documentation

## 2020-12-29 HISTORY — DX: Other complications of anesthesia, initial encounter: T88.59XA

## 2020-12-29 HISTORY — PX: COLONOSCOPY WITH PROPOFOL: SHX5780

## 2020-12-29 SURGERY — COLONOSCOPY WITH PROPOFOL
Anesthesia: General

## 2020-12-29 MED ORDER — LACTATED RINGERS IV SOLN: INTRAVENOUS | Status: DC

## 2020-12-29 MED ORDER — PROPOFOL 10 MG/ML IV BOLUS
INTRAVENOUS | Status: DC | PRN
  Administered 2020-12-29: 30 mg via INTRAVENOUS
  Administered 2020-12-29: 150 mg via INTRAVENOUS
  Administered 2020-12-29: 30 mg via INTRAVENOUS

## 2020-12-29 MED ORDER — ACETAMINOPHEN 325 MG PO TABS: 325.0000 mg | ORAL_TABLET | Freq: Once | ORAL | Status: DC

## 2020-12-29 MED ORDER — LIDOCAINE HCL (CARDIAC) PF 100 MG/5ML IV SOSY
PREFILLED_SYRINGE | INTRAVENOUS | Status: DC | PRN
  Administered 2020-12-29: 50 mg via INTRAVENOUS

## 2020-12-29 MED ORDER — STERILE WATER FOR IRRIGATION IR SOLN: Status: DC | PRN

## 2020-12-29 MED ORDER — SODIUM CHLORIDE 0.9 % IV SOLN: INTRAVENOUS | Status: DC

## 2020-12-29 MED ORDER — ACETAMINOPHEN 160 MG/5ML PO SOLN: 325.0000 mg | Freq: Once | ORAL | Status: DC

## 2020-12-29 SURGICAL SUPPLY — 9 items
FORCEPS BIOP RAD 4 LRG CAP 4 (CUTTING FORCEPS) IMPLANT
GOWN CVR UNV OPN BCK APRN NK (MISCELLANEOUS) ×2 IMPLANT
GOWN ISOL THUMB LOOP REG UNIV (MISCELLANEOUS) ×4
KIT PRC NS LF DISP ENDO (KITS) ×1 IMPLANT
KIT PROCEDURE OLYMPUS (KITS) ×2
MANIFOLD NEPTUNE II (INSTRUMENTS) ×2 IMPLANT
SNARE SHORT THROW 13M SML OVAL (MISCELLANEOUS) IMPLANT
TRAP ETRAP POLY (MISCELLANEOUS) IMPLANT
WATER STERILE IRR 250ML POUR (IV SOLUTION) ×2 IMPLANT

## 2020-12-29 NOTE — Anesthesia Preprocedure Evaluation (Signed)
Anesthesia Evaluation  Patient identified by MRN, date of birth, ID band Patient awake    Reviewed: Allergy & Precautions, H&P , NPO status , Patient's Chart, lab work & pertinent test results  Airway Mallampati: II  TM Distance: >3 FB Neck ROM: full    Dental no notable dental hx.    Pulmonary    Pulmonary exam normal breath sounds clear to auscultation       Cardiovascular Normal cardiovascular exam Rhythm:regular Rate:Normal     Neuro/Psych  Headaches,    GI/Hepatic   Endo/Other    Renal/GU      Musculoskeletal   Abdominal   Peds  Hematology   Anesthesia Other Findings   Reproductive/Obstetrics                             Anesthesia Physical Anesthesia Plan  ASA: II  Anesthesia Plan: General   Post-op Pain Management:    Induction: Intravenous  PONV Risk Score and Plan: 3 and Treatment may vary due to age or medical condition, Propofol infusion and TIVA  Airway Management Planned: Natural Airway  Additional Equipment:   Intra-op Plan:   Post-operative Plan:   Informed Consent: I have reviewed the patients History and Physical, chart, labs and discussed the procedure including the risks, benefits and alternatives for the proposed anesthesia with the patient or authorized representative who has indicated his/her understanding and acceptance.     Dental Advisory Given  Plan Discussed with: CRNA  Anesthesia Plan Comments:         Anesthesia Quick Evaluation

## 2020-12-29 NOTE — H&P (Signed)
Melodie Bouillon, MD [address], [place], [place], [address] [address], [address], [place], [place], [address] Phone: [phone]  Fax: [phone]  Primary Care Physician:  Sherlene Shams, MD   Pre-Procedure History & Physical: HPI:  Candice Vaughn is a 50 y.o. female is here for a colonoscopy.   Past Medical History:  Diagnosis Date  . 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 Complication of anesthesia    woke up at end of surgery  . COVID-19 11/16/2020   Home test.  Had symptoms.  . Migraine syndrome    once a month/ mild migraines    Past Surgical History:  Procedure Laterality Date  . ABDOMINAL HYSTERECTOMY  jan 2012   endometriosis, inflammation, failed ablation  . APPENDECTOMY     teenager  . CESAREAN SECTION     X 2  . LAPAROSCOPIC GASTRIC SLEEVE RESECTION  2016   [facility]  . LIPOMA RESECTION     left axilla, [facility]    Prior to Admission medications   Medication Sig Start Date End Date Taking? Authorizing Provider  ALPRAZolam Prudy Feeler) 0.5 MG tablet TAKE 1 TABLET BY MOUTH EVERY DAY AT BEDTIME AS NEEDED 12/13/20  Yes Sherlene Shams, MD  buPROPion (WELLBUTRIN XL) 300 MG 24 hr tablet TAKE 1 TABLET BY MOUTH EVERY DAY 12/12/20  Yes Sherlene Shams, MD  cyclobenzaprine (FLEXERIL) 5 MG tablet TAKE 1 TABLET BY MOUTH THREE TIMES A DAY AS NEEDED FOR MUSCLE SPASMS Patient taking differently: rarely 06/26/20  Yes Sherlene Shams, MD  lisdexamfetamine (VYVANSE) 20 MG capsule Take 1 capsule (20 mg total) by mouth daily. 12/12/20  Yes Sherlene Shams, MD  naltrexone (DEPADE) 50 MG tablet Take 1 tablet by mouth daily. Weight loss 06/19/20 06/19/21 Yes [provider]  Vitamin D, Ergocalciferol, (DRISDOL) 1.25 MG (50000 UNIT) CAPS capsule TAKE 1 CAPSULE BY MOUTH ONE TIME PER WEEK 12/03/20  Yes Sherlene Shams, MD  HYDROcodone-acetaminophen (NORCO/VICODIN) 5-325 MG tablet Take 1 tablet by mouth every 6 (six)  hours as needed. Patient not taking: Reported on 11/13/2020 10/03/20   Sherlene Shams, MD  lisdexamfetamine (VYVANSE) 20 MG capsule Take 1 capsule (20 mg total) by mouth daily. 01/11/21   Sherlene Shams, MD  metroNIDAZOLE (METROGEL) 1 % gel Apply 1 application topically daily. 06/07/13   Sherlene Shams, MD  nystatin (MYCOSTATIN/NYSTOP) powder Apply topically 2 (two) times daily. To rash until resolved. Patient not taking: Reported on 11/13/2020 04/18/19   Sherlene Shams, MD  polyethylene glycol-electrolytes (NULYTELY) 420 g solution Prepare according to package instructions. Starting at 5:00 PM: Drink one 8 oz glass of mixture every 15 minutes until you finish half of the jug. Five hours prior to procedure, drink 8 oz glass of mixture every 15 minutes until it is all gone. Make sure you do not drink anything 4 hours prior to your procedure. 11/20/20   Pasty Spillers, MD  rizatriptan (MAXALT) 10 MG tablet TAKE 1 TABLET BY MOUTH AS NEEDED MAY REPEAT IN 2 HOURS IF NEEDED Patient not taking: Reported on 11/13/2020 05/01/19   Sherlene Shams, MD  Sodium Sulfate-Mag Sulfate-KCl (SUTAB) [phone] MG TABS Take 376 mg by mouth as directed. Take 12 tablets at 5pm taking each with a sip of water on the evening before colonoscopy. Drink (6) 8 oz cups of water.  Repeat again 5 hours prior to colonoscopy time. Nothing to eat or  drink 4 hours before procedure. 11/18/20   Pasty Spillers, MD    Allergies as of 10/09/2020 - Review Complete 10/09/2020  Allergen Reaction Noted  . Nsaids Other (See Comments) 03/08/2016    Family History  Problem Relation Age of Onset  . Hypothyroidism Maternal Aunt   . Hypothyroidism Maternal Grandmother   . Cancer Neg Hx 38       thyroid ca  . Hypothyroidism Mother     Social History   Socioeconomic History  . Marital status: Married    Spouse name: Not on file  . Number of children: Not on file  . Years of education: Not on file  . Highest education level: Not  on file  Occupational History  . Not on file  Tobacco Use  . Smoking status: Never Smoker  . Smokeless tobacco: Never Used  Substance and Sexual Activity  . Alcohol use: Not Currently  . Drug use: No  . Sexual activity: Not on file  Other Topics Concern  . Not on file  Social History Narrative   Married,  2 children ages 30 and 32.  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 Neck:  Supple; no masses or thyromegaly. Lungs:  Clear throughout to auscultation, normal respiratory effort.    Heart:  +S1, +S2, Regular rate and rhythm, No edema. Abdomen:  Soft, nontender and nondistended. Normal bowel sounds, without guarding, and without rebound.   Neurologic:  Alert and  oriented x4;  grossly normal neurologically.  Impression/Plan: Candice Vaughn is here for a colonoscopy to be performed for average risk screening.  Risks, benefits, limitations, and alternatives regarding  colonoscopy have been reviewed with the patient.  Questions have been answered.  All parties agreeable.   Edilia Bo, MD  12/29/2020, 1:45 PM

## 2020-12-29 NOTE — Op Note (Signed)
[facility] Gastroenterology Patient Name: Candice Vaughn Procedure Date: 12/29/2020 1:46 PM MRN: [id number] Account #: [id number] Date of Birth: 04-26-1971 Admit Type: Outpatient Age: 50 [facility] Gender: Female Note Status: Finalized Procedure:             Colonoscopy Indications:           Screening for colorectal malignant neoplasm Providers:             Olinda Nola B. Maximino Greenlandahiliani MD, MD Referring MD:          Duncan Dulleresa Tullo, MD (Referring MD) Medicines:             Monitored Anesthesia Care Complications:         No immediate complications. Procedure:             Pre-Anesthesia Assessment:                        - Prior to the procedure, a History and Physical was                         performed, and patient medications, allergies and                         sensitivities were reviewed. The patient's tolerance                         of previous anesthesia was reviewed.                        - The risks and benefits of the procedure and the                         sedation options and risks were discussed with the                         patient. All questions were answered and informed                         consent was obtained.                        - Patient identification and proposed procedure were                         verified prior to the procedure by the physician, the                         nurse, the anesthetist and the technician. The                         procedure was verified in the pre-procedure area in                         the procedure room in the endoscopy suite.                        - ASA Grade Assessment: II - A patient with mild  systemic disease.                        - After reviewing the risks and benefits, the patient                         was deemed in satisfactory condition to undergo the                         procedure.                        After obtaining informed consent, the  colonoscope was                         passed under direct vision. Throughout the procedure,                         the patient's blood pressure, pulse, and oxygen                         saturations were monitored continuously. The                         Colonoscope was introduced through the anus and                         advanced to the the cecum, identified by appendiceal                         orifice and ileocecal valve. The colonoscopy was                         performed with ease. The patient tolerated the                         procedure well. The quality of the bowel preparation                         was good. Findings:      The perianal and digital rectal examinations were normal.      The rectum, sigmoid colon, descending colon, transverse colon, ascending       colon and cecum appeared normal.      Anal papilla(e) were hypertrophied.      No additional abnormalities were found on retroflexion. Impression:            - The rectum, sigmoid colon, descending colon,                         transverse colon, ascending colon and cecum are normal.                        - Anal papilla(e) were hypertrophied.                        - No specimens collected. Recommendation:        - Refer to [facility] to evaluate for EUA. One  of the hypertrophied anal papillae is larger than                         usual. Surgery to evaluate if this needs biopsies or                         removal.                        - Discharge patient to home.                        - Resume previous diet.                        - Continue present medications.                        - Repeat colonoscopy in 10 years for screening                         purposes.                        - Return to primary care physician as previously                         scheduled.                        - The findings and recommendations were discussed with                          the patient.                        - The findings and recommendations were discussed with                         the patient's family. Procedure Code(s):     --- Professional ---                        [phone], Colonoscopy, flexible; diagnostic, including                         collection of specimen(s) by brushing or washing, when                         performed (separate procedure) Diagnosis Code(s):     --- Professional ---                        Z12.11, Encounter for screening for malignant neoplasm                         of colon CPT copyright 2019 [employer]. All rights reserved. The codes documented in this report are preliminary and upon coder review may  be revised to meet current compliance requirements.  Melodie Bouillon, MD Michel Bickers B. Maximino Greenland MD, MD 12/29/2020 2:24:18 PM This report has been signed electronically. Number of Addenda: 0 Note Initiated On: 12/29/2020 1:46 PM Scope Withdrawal Time: 0 hours  16 minutes 2 seconds  Total Procedure Duration: 0 hours 22 minutes 45 seconds       [facility]

## 2020-12-29 NOTE — Transfer of Care (Signed)
Immediate Anesthesia Transfer of Care Note  Patient: Candice Vaughn  Procedure(s) Performed: COLONOSCOPY WITH PROPOFOL (N/A )  Patient Location: PACU  Anesthesia Type: General  Level of Consciousness: awake, alert  and patient cooperative  Airway and Oxygen Therapy: Patient Spontanous Breathing and Patient connected to supplemental oxygen  Post-op Assessment: Post-op Vital signs reviewed, Patient's Cardiovascular Status Stable, Respiratory Function Stable, Patent Airway and No signs of Nausea or vomiting  Post-op Vital Signs: Reviewed and stable  Complications: No complications documented.

## 2020-12-29 NOTE — Anesthesia Postprocedure Evaluation (Signed)
Anesthesia Post Note  Patient: Candice Vaughn  Procedure(s) Performed: COLONOSCOPY WITH PROPOFOL (N/A )     Patient location during evaluation: PACU Anesthesia Type: General Level of consciousness: awake and alert and oriented Pain management: satisfactory to patient Vital Signs Assessment: post-procedure vital signs reviewed and stable Respiratory status: spontaneous breathing, nonlabored ventilation and respiratory function stable Cardiovascular status: blood pressure returned to baseline and stable Postop Assessment: Adequate PO intake and No signs of nausea or vomiting Anesthetic complications: no   No complications documented.  Cherly [place]

## 2020-12-30 ENCOUNTER — Telehealth: Payer: Self-pay

## 2020-12-30 NOTE — Telephone Encounter (Signed)
Referral was faxed to [facility] surgery. They will contact patient for a consult date and time.

## 2020-12-30 NOTE — Telephone Encounter (Signed)
-----   Message from Pasty Spillers, MD sent at 12/29/2020  2:51 PM EST ----- Refer to [facility] for "evaluation of one large hypertrophied anal papillae. Question need for EUA"

## 2021-01-20 ENCOUNTER — Other Ambulatory Visit: Payer: Self-pay | Admitting: Internal Medicine

## 2021-02-03 ENCOUNTER — Ambulatory Visit: Payer: Self-pay | Admitting: Surgery

## 2021-02-26 ENCOUNTER — Telehealth: Payer: Self-pay | Admitting: Internal Medicine

## 2021-02-26 ENCOUNTER — Other Ambulatory Visit: Payer: Self-pay | Admitting: Internal Medicine

## 2021-02-26 MED ORDER — BUPROPION HCL ER (XL) 300 MG PO TB24: 1.0000 | ORAL_TABLET | Freq: Every day | ORAL | 1 refills | Status: DC

## 2021-02-26 NOTE — Addendum Note (Signed)
Addended by: Elise Benne T on: 02/26/2021 02:15 PM   Modules accepted: Orders

## 2021-02-26 NOTE — Telephone Encounter (Signed)
PT called in stating that the Rx told her that the buPROPion (WELLBUTRIN XL) 300 MG 24 hr tablet was not sent over yet and would like a refill to be called in.

## 2021-04-07 ENCOUNTER — Other Ambulatory Visit: Payer: Self-pay | Admitting: Internal Medicine

## 2021-04-07 NOTE — Telephone Encounter (Signed)
PT called to advise that she needs a refill of her lisdexamfetamine (VYVANSE) 20 MG capsule.

## 2021-04-07 NOTE — Telephone Encounter (Signed)
Refilled: 01/11/2021 Last OV: 10/03/2020 Next OV: 10/05/2021

## 2021-04-07 NOTE — Addendum Note (Signed)
Addended by: Sandy Salaam on: 04/07/2021 03:21 PM   Modules accepted: Orders

## 2021-04-08 MED ORDER — LISDEXAMFETAMINE DIMESYLATE 20 MG PO CAPS
20.0000 mg | ORAL_CAPSULE | Freq: Every day | ORAL | 0 refills | Status: DC
Start: 2021-04-08 — End: 2021-05-11

## 2021-05-11 ENCOUNTER — Telehealth: Payer: Self-pay | Admitting: Internal Medicine

## 2021-05-11 MED ORDER — LISDEXAMFETAMINE DIMESYLATE 20 MG PO CAPS: 20.0000 mg | ORAL_CAPSULE | Freq: Every day | ORAL | 0 refills | Status: DC

## 2021-05-11 MED ORDER — LISDEXAMFETAMINE DIMESYLATE 20 MG PO CAPS
20.0000 mg | ORAL_CAPSULE | Freq: Every day | ORAL | 0 refills | Status: DC
Start: 2021-06-10 — End: 2023-10-25

## 2021-05-11 NOTE — Addendum Note (Signed)
Addended by: Sherlene Shams on: 05/11/2021 06:14 PM   Modules accepted: Orders

## 2021-05-11 NOTE — Telephone Encounter (Signed)
Pt request a refill on Vyvanse. Last refilled on 01/11/21 Last seen on 02/26/2021

## 2021-05-11 NOTE — Telephone Encounter (Signed)
Patient is requesting a refill on her lisdexamfetamine (VYVANSE) 20 MG capsule, she would like a 3 month supply.

## 2021-05-11 NOTE — Telephone Encounter (Signed)
Sending to you for refill since not urgent

## 2021-09-08 ENCOUNTER — Telehealth: Payer: Self-pay | Admitting: Internal Medicine

## 2021-09-08 DIAGNOSIS — Z808 Family history of malignant neoplasm of other organs or systems: Secondary | ICD-10-CM

## 2021-09-08 NOTE — Telephone Encounter (Signed)
Wants a referral to move from [facility] to [facility] Dr. Evelena Peat

## 2021-09-19 ENCOUNTER — Other Ambulatory Visit: Payer: Self-pay | Admitting: Internal Medicine

## 2021-09-19 DIAGNOSIS — E559 Vitamin D deficiency, unspecified: Secondary | ICD-10-CM

## 2021-09-20 ENCOUNTER — Encounter: Payer: Self-pay | Admitting: Internal Medicine

## 2021-09-21 NOTE — Telephone Encounter (Signed)
Refill denied until she is seen.  Last refill was in march.  Last OV Dec 2021

## 2021-09-21 NOTE — Telephone Encounter (Signed)
Okay  to fill alprazolam has an appointment scheduled for 10/05/21 last appt 10/03/20 last fill 01/20/21 ?

## 2021-09-22 ENCOUNTER — Encounter: Payer: Self-pay | Admitting: *Deleted

## 2021-10-05 ENCOUNTER — Ambulatory Visit (INDEPENDENT_AMBULATORY_CARE_PROVIDER_SITE_OTHER): Payer: BC Managed Care – PPO | Admitting: Internal Medicine

## 2021-10-05 ENCOUNTER — Encounter: Payer: Self-pay | Admitting: Internal Medicine

## 2021-10-05 ENCOUNTER — Other Ambulatory Visit: Payer: Self-pay

## 2021-10-05 VITALS — BP 128/76 | HR 73 | Temp 96.5°F | Ht 65.0 in | Wt 213.2 lb

## 2021-10-05 DIAGNOSIS — Z9884 Bariatric surgery status: Secondary | ICD-10-CM

## 2021-10-05 DIAGNOSIS — Z0000 Encounter for general adult medical examination without abnormal findings: Secondary | ICD-10-CM

## 2021-10-05 DIAGNOSIS — R7301 Impaired fasting glucose: Secondary | ICD-10-CM | POA: Diagnosis not present

## 2021-10-05 DIAGNOSIS — Z91030 Bee allergy status: Secondary | ICD-10-CM | POA: Insufficient documentation

## 2021-10-05 DIAGNOSIS — E559 Vitamin D deficiency, unspecified: Secondary | ICD-10-CM | POA: Diagnosis not present

## 2021-10-05 DIAGNOSIS — E785 Hyperlipidemia, unspecified: Secondary | ICD-10-CM

## 2021-10-05 DIAGNOSIS — Z1231 Encounter for screening mammogram for malignant neoplasm of breast: Secondary | ICD-10-CM

## 2021-10-05 DIAGNOSIS — Z79899 Other long term (current) drug therapy: Secondary | ICD-10-CM | POA: Diagnosis not present

## 2021-10-05 DIAGNOSIS — K602 Anal fissure, unspecified: Secondary | ICD-10-CM

## 2021-10-05 DIAGNOSIS — R61 Generalized hyperhidrosis: Secondary | ICD-10-CM

## 2021-10-05 DIAGNOSIS — E669 Obesity, unspecified: Secondary | ICD-10-CM

## 2021-10-05 MED ORDER — NALTREXONE HCL 50 MG PO TABS: 50.0000 mg | ORAL_TABLET | Freq: Every day | ORAL | 1 refills | Status: DC

## 2021-10-05 MED ORDER — PREDNISONE 10 MG PO TABS: ORAL_TABLET | ORAL | 0 refills | Status: DC

## 2021-10-05 MED ORDER — BUPROPION HCL ER (XL) 300 MG PO TB24: 300.0000 mg | ORAL_TABLET | Freq: Every day | ORAL | 1 refills | Status: DC

## 2021-10-05 MED ORDER — EPINEPHRINE 0.3 MG/0.3ML IJ SOAJ
0.3000 mg | INTRAMUSCULAR | 2 refills | Status: AC | PRN
Start: 2021-10-05 — End: ?

## 2021-10-05 NOTE — Progress Notes (Signed)
Patient ID: Candice Vaughn, female    DOB: 05-Apr-1971  Age: 50 y.o. MRN: [id number]  The patient is here for annual preventive  examination and management of other chronic and acute problems.   The risk factors are reflected in the social history.  The roster of all physicians providing medical care to patient - is listed in the Snapshot section of the chart.  Activities of daily living:  The patient is 100% independent in all ADLs: dressing, toileting, feeding as well as independent mobility  Home safety : The patient has smoke detectors in the home. They wear seatbelts.  There are no firearms at home. There is no violence in the home.   There is no risks for hepatitis, STDs or HIV. There is no   history of blood transfusion. They have no travel history to infectious disease endemic areas of the world.  The patient has seen their dentist in the last six month. They have seen their eye doctor in the last year. They admit to slight hearing difficulty with regard to whispered voices and some television programs.  They have deferred audiologic testing in the last year.  They do not  have excessive sun exposure. Discussed the need for sun protection: hats, long sleeves and use of sunscreen if there is significant sun exposure.   Diet: the importance of a healthy diet is discussed. They do have a healthy diet.  The benefits of regular aerobic exercise were discussed. She walks 4 times per week ,  20 minutes.   Depression screen: there are no signs or vegative symptoms of depression- irritability, change in appetite, anhedonia, sadness/tearfullness.  Cognitive assessment: the patient manages all their financial and personal affairs and is actively engaged. They could relate day,date,year and events; recalled 2/3 objects at 3 minutes; performed clock-face test normally.  The following portions of the patient's history were reviewed and updated as appropriate: allergies, current medications, past  family history, past medical history,  past surgical history, past social history  and problem list.  Visual acuity was not assessed per patient preference since she has regular follow up with her ophthalmologist. Hearing and body mass index were assessed and reviewed.   During the course of the visit the patient was educated and counseled about appropriate screening and preventive services including : fall prevention , diabetes screening, nutrition counseling, colorectal cancer screening, and recommended immunizations.    CC: The primary encounter diagnosis was Encounter for preventive health examination. Diagnoses of Hyperlipidemia, unspecified hyperlipidemia type, Impaired fasting glucose, Long-term use of high-risk medication, Encounter for screening mammogram for malignant neoplasm of breast, Obesity (BMI 30-39.9), S/P bariatric surgery, Allergy to bee sting, Vitamin D deficiency, Hyperhidrosis, and Anal fissure were also pertinent to this visit.  1) Weight regain:  has success with weight loss when taking naltrexone and wellbutrin but has not been taking it because it erases her desire for wine.  States that most of her caloric intake is wine since her gastric sleeve.  2) IDA;  chronic since her gastric sleeve.    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   She has a past surgical history that includes Cesarean section; Abdominal hysterectomy (jan 2012); Lipoma resection; Laparoscopic gastric sleeve resection (2016); Appendectomy; and Colonoscopy with propofol (N/A, 12/29/2020).   Her family history includes Hypothyroidism in her maternal aunt, maternal grandmother,  and mother.She reports that she has never smoked. She has never used smokeless tobacco. She reports that she does not currently use alcohol. She  reports that she does not use drugs.  Outpatient Medications Prior to Visit  Medication Sig Dispense Refill   ALPRAZolam (XANAX) 0.5 MG tablet TAKE 1 TABLET BY MOUTH EVERY DAY AT BEDTIME AS NEEDED 30 tablet 1   buPROPion (WELLBUTRIN XL) 300 MG 24 hr tablet Take 1 tablet (300 mg total) by mouth daily. 90 tablet 1   cyclobenzaprine (FLEXERIL) 5 MG tablet TAKE 1 TABLET BY MOUTH THREE TIMES A DAY AS NEEDED FOR MUSCLE SPASMS (Patient taking differently: rarely) 90 tablet 1   lisdexamfetamine (VYVANSE) 20 MG capsule Take 1 capsule (20 mg total) by mouth daily. 30 capsule 0   lisdexamfetamine (VYVANSE) 20 MG capsule Take 1 capsule (20 mg total) by mouth daily. 30 capsule 0   lisdexamfetamine (VYVANSE) 20 MG capsule Take 1 capsule (20 mg total) by mouth daily. 30 capsule 0   lisdexamfetamine (VYVANSE) 20 MG capsule Take 1 capsule (20 mg total) by mouth daily. 30 capsule 0   metroNIDAZOLE (METROGEL) 1 % gel Apply 1 application topically daily. 45 g 1   rizatriptan (MAXALT) 10 MG tablet TAKE 1 TABLET BY MOUTH AS NEEDED MAY REPEAT IN 2 HOURS IF NEEDED 10 tablet 2   Vitamin D, Ergocalciferol, (DRISDOL) 1.25 MG (50000 UNIT) CAPS capsule TAKE 1 CAPSULE BY MOUTH ONE TIME PER WEEK 12 capsule 0   buPROPion (WELLBUTRIN XL) 300 MG 24 hr tablet TAKE 1 TABLET BY MOUTH EVERY DAY 90 tablet 0   naltrexone (DEPADE) 50 MG tablet Take 50 mg by mouth daily.     nystatin (MYCOSTATIN/NYSTOP) powder Apply topically 2 (two) times daily. To rash until resolved. (Patient not taking: Reported on 11/13/2020) 15 g 3   polyethylene glycol-electrolytes (NULYTELY) 420 g solution Prepare according to package instructions. Starting at 5:00 PM: Drink one 8 oz glass of mixture every 15 minutes until you finish half of the jug. Five hours prior to procedure, drink 8 oz glass of mixture every 15 minutes until it is all gone. Make sure you do not drink anything 4 hours prior to your procedure. (Patient not taking: Reported on 10/05/2021) 4000 mL  0   Sodium Sulfate-Mag Sulfate-KCl (SUTAB) [phone] MG TABS Take 376 mg by mouth as directed. Take 12 tablets at 5pm taking each with a sip of water on the evening before colonoscopy. Drink (6) 8 oz cups of water.  Repeat again 5 hours prior to colonoscopy time. Nothing to eat or drink 4 hours before procedure. (Patient not taking: Reported on 10/05/2021) 24 tablet 0   No facility-administered medications prior to visit.    Review of Systems  Patient denies headache, fevers, malaise, unintentional weight loss, skin rash, eye pain, sinus congestion and sinus pain, sore throat, dysphagia,  hemoptysis , cough, dyspnea, wheezing, chest pain, palpitations, orthopnea, edema, abdominal pain, nausea, melena, diarrhea, constipation, flank pain, dysuria, hematuria, urinary  Frequency, nocturia, numbness, tingling, seizures,  Focal weakness, Loss of consciousness,  Tremor, insomnia, depression, anxiety, and suicidal ideation.     Objective:  BP 128/76 (BP Location: Left Arm, Patient Position: Sitting, Cuff Size: Large)   Pulse 73   Temp (!) 96.5 F (35.8 C) (Temporal)   Ht $R'5\' 5"'is$  (1.651 m)   Wt 213 lb 3.2 oz (96.7 kg)   SpO2 97%   BMI 35.48 kg/m   Physical Exam  General appearance: alert, cooperative and appears stated  age Head: Normocephalic, without obvious abnormality, atraumatic Eyes: conjunctivae/corneas clear. PERRL, EOM's intact. Fundi benign. Ears: normal TM's and external ear canals both ears Nose: Nares normal. Septum midline. Mucosa normal. No drainage or sinus tenderness. Throat: lips, mucosa, and tongue normal; teeth and gums normal Neck: no adenopathy, no carotid bruit, no JVD, supple, symmetrical, trachea midline and thyroid not enlarged, symmetric, no tenderness/mass/nodules Lungs: clear to auscultation bilaterally Breasts: pendulous breasts,  no masses or tenderness Heart: regular rate and rhythm, S1, S2 normal, no murmur, click, rub or gallop Abdomen: soft, obese with  striae, non-tender; bowel sounds normal; no masses,  no organomegaly Extremities: extremities normal, atraumatic, no cyanosis or edema Pulses: 2+ and symmetric Skin: Skin color, texture, turgor normal. No rashes or lesions Neurologic: Alert and oriented X 3, normal strength and tone. Normal symmetric reflexes. Normal coordination and gait.     Assessment & Plan:   Problem List Items Addressed This Visit     Vitamin D deficiency   Relevant Orders   VITAMIN D 25 Hydroxy (Vit-D Deficiency, Fractures)   S/P bariatric surgery   Relevant Orders   Iron, TIBC and Ferritin Panel   Obesity (BMI 30-39.9)    Personal best was 176 3 years after her bariatric surgery.  37 lb weight gain since then , advised to resume h Wellbutrin XL 300 mg and naltrexone 50 mg daily      Hyperhidrosis    Managed now with botox injections every 90 days by [facility]        Encounter for preventive health examination - Primary    age appropriate education and counseling updated, referrals for preventative services and immunizations addressed, dietary and smoking counseling addressed, most recent labs reviewed.  I have personally reviewed and have noted:   1) the patient's medical and social history 2) The pt's use of alcohol, tobacco, and illicit drugs 3) The patient's current medications and supplements 4) Functional ability including ADL's, fall risk, home safety risk, hearing and visual impairment 5) Diet and physical activities 6) Evidence for depression or mood disorder 7) The patient's height, weight, and BMI have been recorded in the chart   I have made referrals, and provided counseling and education based on review of the above      Anal fissure    She was referred to [facility] for hypertrophied anal papillae noted during march 2022 colonoscopy  Which was done,  No surgery advised,  Continue prnuse of nupercainal      Allergy to bee sting   Other Visit Diagnoses      Hyperlipidemia, unspecified hyperlipidemia type       Relevant Medications   EPINEPHrine 0.3 mg/0.3 mL IJ SOAJ injection   Other Relevant Orders   Lipid Profile   Impaired fasting glucose       Relevant Orders   HgB A1c   Comp Met (CMET)   Long-term use of high-risk medication       Relevant Orders   TSH   CBC with Differential/Platelet   Encounter for screening mammogram for malignant neoplasm of breast       Relevant Orders   MM 3D SCREEN BREAST BILATERAL       I have discontinued Candice Vaughn "Libby"'s nystatin, Sutab, and polyethylene glycol-electrolytes. I have also changed her naltrexone and buPROPion. Additionally, I am having her start on EPINEPHrine and predniSONE. Lastly, I am having her maintain her metroNIDAZOLE, cyclobenzaprine, ALPRAZolam, rizatriptan, buPROPion, lisdexamfetamine, lisdexamfetamine, lisdexamfetamine, lisdexamfetamine, and  Vitamin D (Ergocalciferol).  Meds ordered this encounter  Medications   EPINEPHrine 0.3 mg/0.3 mL IJ SOAJ injection    Sig: Inject 0.3 mg into the muscle as needed for anaphylaxis.    Dispense:  1 each    Refill:  2   predniSONE (DELTASONE) 10 MG tablet    Sig: 6 tablets on Day 1 , then reduce by 1 tablet daily until gone    Dispense:  21 tablet    Refill:  0   naltrexone (DEPADE) 50 MG tablet    Sig: Take 1 tablet (50 mg total) by mouth daily.    Dispense:  90 tablet    Refill:  1   buPROPion (WELLBUTRIN XL) 300 MG 24 hr tablet    Sig: Take 1 tablet (300 mg total) by mouth daily.    Dispense:  90 tablet    Refill:  1    Medications Discontinued During This Encounter  Medication Reason   Sodium Sulfate-Mag Sulfate-KCl (SUTAB) [phone] MG TABS    polyethylene glycol-electrolytes (NULYTELY) 420 g solution    nystatin (MYCOSTATIN/NYSTOP) powder    buPROPion (WELLBUTRIN XL) 300 MG 24 hr tablet Reorder   naltrexone (DEPADE) 50 MG tablet Reorder    Follow-up: No follow-ups on file.   Crecencio Mc, MD

## 2021-10-05 NOTE — Patient Instructions (Addendum)
Nupercainal for rectal pain   Get back on wellbutrin and naltrexone   Epi pen refilled  I'll get the contact info for the food preparer

## 2021-10-05 NOTE — Assessment & Plan Note (Signed)
She was referred to [facility] for hypertrophied anal papillae noted during march 2022 colonoscopy  Which was done,  No surgery advised,  Continue prnuse of nupercainal

## 2021-10-05 NOTE — Assessment & Plan Note (Signed)

## 2021-10-05 NOTE — Addendum Note (Signed)
Addended by: Warden Fillers on: 10/05/2021 03:46 PM   Modules accepted: Orders

## 2021-10-05 NOTE — Assessment & Plan Note (Signed)
Managed now with botox injections every 90 days [facility] [place]  [facility]

## 2021-10-05 NOTE — Assessment & Plan Note (Addendum)
Personal best was 176 3 years after her bariatric surgery.  37 lb weight gain since then , advised to resume h Wellbutrin XL 300 mg and naltrexone 50 mg daily

## 2021-10-06 LAB — COMPREHENSIVE METABOLIC PANEL
ALT: 16 U/L (ref 0–35)
AST: 17 U/L (ref 0–37)
Albumin: 4 g/dL (ref 3.5–5.2)
Alkaline Phosphatase: 28 U/L — ABNORMAL LOW (ref 39–117)
BUN: 16 mg/dL (ref 6–23)
CO2: 27 mEq/L (ref 19–32)
Calcium: 9.6 mg/dL (ref 8.4–10.5)
Chloride: 102 mEq/L (ref 96–112)
Creatinine, Ser: 0.73 mg/dL (ref 0.40–1.20)
GFR: 96.69 mL/min (ref >60.00)
Glucose, Bld: 84 mg/dL (ref 70–99)
Potassium: 4.1 mEq/L (ref 3.5–5.1)
Sodium: 138 mEq/L (ref 135–145)
Total Bilirubin: 0.4 mg/dL (ref 0.2–1.2)
Total Protein: 6.5 g/dL (ref 6.0–8.3)

## 2021-10-06 LAB — CBC WITH DIFFERENTIAL/PLATELET
Basophils Absolute: 0.1 10*3/uL (ref 0.0–0.1)
Basophils Relative: 0.5 % (ref 0.0–3.0)
Eosinophils Absolute: 0.1 10*3/uL (ref 0.0–0.7)
Eosinophils Relative: 0.8 % (ref 0.0–5.0)
HCT: 42.4 % (ref 36.0–46.0)
Hemoglobin: 14 g/dL (ref 12.0–15.0)
Lymphocytes Relative: 20 % (ref 12.0–46.0)
Lymphs Abs: 1.9 10*3/uL (ref 0.7–4.0)
MCHC: 33.1 g/dL (ref 30.0–36.0)
MCV: 94.6 fl (ref 78.0–100.0)
Monocytes Absolute: 0.5 10*3/uL (ref 0.1–1.0)
Monocytes Relative: 5.3 % (ref 3.0–12.0)
Neutro Abs: 7.1 10*3/uL (ref 1.4–7.7)
Neutrophils Relative %: 73.4 % (ref 43.0–77.0)
Platelets: 274 10*3/uL (ref 150.0–400.0)
RBC: 4.48 Mil/uL (ref 3.87–5.11)
RDW: 13.2 % (ref 11.5–15.5)
WBC: 9.7 10*3/uL (ref 4.0–10.5)

## 2021-10-06 LAB — LIPID PANEL
Cholesterol: 168 mg/dL (ref 0–200)
HDL: 73.5 mg/dL (ref >39.00)
LDL Cholesterol: 63 mg/dL (ref 0–99)
NonHDL: 94.39
Total CHOL/HDL Ratio: 2
Triglycerides: 155 mg/dL — ABNORMAL HIGH (ref 0.0–149.0)
VLDL: 31 mg/dL (ref 0.0–40.0)

## 2021-10-06 LAB — TSH: TSH: 1.39 u[IU]/mL (ref 0.35–5.50)

## 2021-10-06 LAB — IBC + FERRITIN
Ferritin: 95.3 ng/mL (ref 10.0–291.0)
Iron: 99 ug/dL (ref 42–145)
Saturation Ratios: 26.6 % (ref 20.0–50.0)
TIBC: 372.4 ug/dL (ref 250.0–450.0)
Transferrin: 266 mg/dL (ref 212.0–360.0)

## 2021-10-06 LAB — VITAMIN D 25 HYDROXY (VIT D DEFICIENCY, FRACTURES): VITD: 24.46 ng/mL — ABNORMAL LOW (ref 30.00–100.00)

## 2021-10-06 LAB — HEMOGLOBIN A1C: Hgb A1c MFr Bld: 5.6 % (ref 4.6–6.5)

## 2021-11-26 ENCOUNTER — Other Ambulatory Visit: Payer: Self-pay | Admitting: Internal Medicine

## 2021-11-26 NOTE — Telephone Encounter (Signed)
Refilled: 08/09/2021 Last OV: 10/05/2021 Next OV: 04/05/2022

## 2021-11-26 NOTE — Telephone Encounter (Signed)
Patient called in stated need a refill for VYVANSE 20 MG   need to be sent to [facility] [address] in [place] [facility] # [phone], Completely out of this medication and  I did advise the patient it normally may take for refill request 48 to 72 hrs

## 2021-11-27 MED ORDER — LISDEXAMFETAMINE DIMESYLATE 20 MG PO CAPS: 20.0000 mg | ORAL_CAPSULE | Freq: Every day | ORAL | 0 refills | Status: DC

## 2021-12-31 ENCOUNTER — Other Ambulatory Visit: Payer: Self-pay | Admitting: Internal Medicine

## 2021-12-31 DIAGNOSIS — E559 Vitamin D deficiency, unspecified: Secondary | ICD-10-CM

## 2022-01-22 ENCOUNTER — Telehealth: Payer: Self-pay | Admitting: Internal Medicine

## 2022-01-22 ENCOUNTER — Other Ambulatory Visit: Payer: Self-pay

## 2022-01-22 DIAGNOSIS — F909 Attention-deficit hyperactivity disorder, unspecified type: Secondary | ICD-10-CM

## 2022-01-22 NOTE — Telephone Encounter (Signed)
Msg sent to pcp for refill ?

## 2022-01-22 NOTE — Telephone Encounter (Signed)
Pt called in stating if provider would take her son on. Pt son name jonathan  brooks Wortmann ?She wants to ask you "do you know who she thinks she is" Yevonne Pax ?

## 2022-01-22 NOTE — Telephone Encounter (Signed)
Patient is requesting a refill on her lisdexamfetamine (VYVANSE) 20 MG capsule. ?

## 2022-01-24 MED ORDER — LISDEXAMFETAMINE DIMESYLATE 20 MG PO CAPS: 20.0000 mg | ORAL_CAPSULE | Freq: Every day | ORAL | 0 refills | Status: DC

## 2022-01-25 NOTE — Telephone Encounter (Signed)
Did you agree take pt's son as a new pt?  ?

## 2022-01-25 NOTE — Telephone Encounter (Signed)
Pt is 19 and Dr. Darrick Huntsman verbally stated that she would take him on as a new patient. Can you please get pt's son scheduled.  ?

## 2022-01-26 NOTE — Telephone Encounter (Signed)
Lm on vm to call office to set up new patient per Dr Darrick Huntsman. ?

## 2022-01-28 LAB — HM MAMMOGRAPHY

## 2022-04-05 ENCOUNTER — Ambulatory Visit: Payer: BC Managed Care – PPO | Admitting: Internal Medicine

## 2022-05-05 ENCOUNTER — Other Ambulatory Visit: Payer: Self-pay | Admitting: Internal Medicine

## 2022-05-06 ENCOUNTER — Encounter: Payer: Self-pay | Admitting: Internal Medicine

## 2022-05-06 ENCOUNTER — Ambulatory Visit: Payer: BC Managed Care – PPO | Admitting: Internal Medicine

## 2022-05-06 VITALS — BP 120/72 | HR 82 | Temp 97.9°F | Ht 65.0 in | Wt 205.8 lb

## 2022-05-06 DIAGNOSIS — N62 Hypertrophy of breast: Secondary | ICD-10-CM

## 2022-05-06 DIAGNOSIS — E669 Obesity, unspecified: Secondary | ICD-10-CM

## 2022-05-06 DIAGNOSIS — F909 Attention-deficit hyperactivity disorder, unspecified type: Secondary | ICD-10-CM | POA: Diagnosis not present

## 2022-05-06 DIAGNOSIS — Z79899 Other long term (current) drug therapy: Secondary | ICD-10-CM

## 2022-05-06 LAB — COMPREHENSIVE METABOLIC PANEL
ALT: 16 U/L (ref 0–35)
AST: 17 U/L (ref 0–37)
Albumin: 4.4 g/dL (ref 3.5–5.2)
Alkaline Phosphatase: 30 U/L — ABNORMAL LOW (ref 39–117)
BUN: 17 mg/dL (ref 6–23)
CO2: 26 mEq/L (ref 19–32)
Calcium: 9.6 mg/dL (ref 8.4–10.5)
Chloride: 104 mEq/L (ref 96–112)
Creatinine, Ser: 0.73 mg/dL (ref 0.40–1.20)
GFR: 96.29 mL/min (ref >60.00)
Glucose, Bld: 79 mg/dL (ref 70–99)
Potassium: 4.2 mEq/L (ref 3.5–5.1)
Sodium: 139 mEq/L (ref 135–145)
Total Bilirubin: 0.4 mg/dL (ref 0.2–1.2)
Total Protein: 7 g/dL (ref 6.0–8.3)

## 2022-05-06 MED ORDER — LISDEXAMFETAMINE DIMESYLATE 20 MG PO CAPS: 20.0000 mg | ORAL_CAPSULE | Freq: Every day | ORAL | 0 refills | Status: DC

## 2022-05-06 MED ORDER — NYSTATIN 100000 UNIT/GM EX POWD: 1.0000 | Freq: Two times a day (BID) | CUTANEOUS | 3 refills | Status: AC

## 2022-05-06 MED ORDER — ONDANSETRON 4 MG PO TBDP: 4.0000 mg | ORAL_TABLET | Freq: Three times a day (TID) | ORAL | 0 refills | Status: DC | PRN

## 2022-05-06 MED ORDER — HYDROCODONE-ACETAMINOPHEN 5-325 MG PO TABS: 1.0000 | ORAL_TABLET | Freq: Four times a day (QID) | ORAL | 0 refills | Status: DC | PRN

## 2022-05-06 MED ORDER — ALPRAZOLAM 0.5 MG PO TABS: ORAL_TABLET | ORAL | 0 refills | Status: DC

## 2022-05-06 MED ORDER — SEMAGLUTIDE(0.25 OR 0.5MG/DOS) 2 MG/3ML ~~LOC~~ SOPN: 0.2500 mg | PEN_INJECTOR | SUBCUTANEOUS | Status: DC

## 2022-05-06 MED ORDER — NALTREXONE HCL 50 MG PO TABS: 50.0000 mg | ORAL_TABLET | Freq: Every day | ORAL | 1 refills | Status: DC

## 2022-05-06 MED ORDER — BUPROPION HCL ER (XL) 300 MG PO TB24
300.0000 mg | ORAL_TABLET | Freq: Every day | ORAL | 1 refills | Status: DC
Start: 2022-05-06 — End: 2023-01-25

## 2022-05-06 NOTE — Progress Notes (Signed)
Subjective:  Patient ID: Candice Vaughn, female    DOB: 10/31/1970  Age: 51 y.o. MRN: [id number]  CC: The primary encounter diagnosis was Long-term use of high-risk medication. 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   HPI Candice Vaughn presents for follow up on multiple issue s Chief Complaint  Patient presents with   Follow-up    6 month follow up   1) obesity : started Ozempic  at the 0.25 mg weekly dose  5 weeks ago (prescribed by weight loss doctor Dr Tresa Res) and has lost 9 lbs,  tolerating it well,  has some diarrhea on day of injection  . Still losing weight  . Still taking wellbutrin /naloxone.  2) breast hypertrophy:   she continues to suffer from shoulder ridges, back pain and has recurrent candida rashes  under breasts . She is requesting  refill on nystatin powder   3) ADD:  managed with Vyvanse.  Forgets to take it several days per week    Outpatient Medications Prior to Visit  Medication Sig Dispense Refill   cyclobenzaprine (FLEXERIL) 5 MG tablet TAKE 1 TABLET BY MOUTH THREE TIMES A DAY AS NEEDED FOR MUSCLE SPASMS (Patient taking differently: rarely) 90 tablet 1   EPINEPHrine 0.3 mg/0.3 mL IJ SOAJ injection Inject 0.3 mg into the muscle as needed for anaphylaxis. 1 each 2   lisdexamfetamine (VYVANSE) 20 MG capsule Take 1 capsule (20 mg total) by mouth daily. 30 capsule 0   lisdexamfetamine (VYVANSE) 20 MG capsule Take 1 capsule (20 mg total) by mouth daily. 30 capsule 0   lisdexamfetamine (VYVANSE) 20 MG capsule Take 1 capsule (20 mg total) by mouth daily. 30 capsule 0   metroNIDAZOLE (METROGEL) 1 % gel Apply 1 application topically daily. 45 g 1   rizatriptan (MAXALT) 10 MG tablet TAKE 1 TABLET BY MOUTH AS NEEDED MAY REPEAT IN 2 HOURS IF NEEDED 10 tablet 2   ALPRAZolam (XANAX) 0.5 MG tablet TAKE 1 TABLET BY MOUTH EVERY DAY AT BEDTIME AS NEEDED 30 tablet 1    buPROPion (WELLBUTRIN XL) 300 MG 24 hr tablet Take 1 tablet (300 mg total) by mouth daily. 90 tablet 1   lisdexamfetamine (VYVANSE) 20 MG capsule Take 1 capsule (20 mg total) by mouth daily. 30 capsule 0   naltrexone (DEPADE) 50 MG tablet Take 1 tablet (50 mg total) by mouth daily. 90 tablet 1   naloxone (NARCAN) nasal spray 4 mg/0.1 mL 1 spray daily.     buPROPion (WELLBUTRIN XL) 300 MG 24 hr tablet TAKE 1 TABLET BY MOUTH EVERY DAY (Patient not taking: Reported on 05/06/2022) 90 tablet 1   predniSONE (DELTASONE) 10 MG tablet 6 tablets on Day 1 , then reduce by 1 tablet daily until gone (Patient not taking: Reported on 05/06/2022) 21 tablet 0   Vitamin D, Ergocalciferol, (DRISDOL) 1.25 MG (50000 UNIT) CAPS capsule TAKE 1 CAPSULE BY MOUTH ONE TIME PER WEEK (Patient not taking: Reported on 05/06/2022) 12 capsule 0   No facility-administered medications prior to visit.    Review of Systems;  Patient denies headache, fevers, malaise, unintentional weight loss, skin rash, eye pain, sinus congestion and sinus pain, sore throat, dysphagia,  hemoptysis , cough, dyspnea, wheezing, chest pain, palpitations, orthopnea, edema, abdominal pain, nausea, melena, diarrhea, constipation, flank pain, dysuria, hematuria, urinary  Frequency, nocturia, numbness, tingling, seizures,  Focal weakness, Loss of consciousness,  Tremor, insomnia, depression, anxiety, and suicidal ideation.  Objective:  BP 120/72 (BP Location: Left Arm, Patient Position: Sitting, Cuff Size: Normal)   Pulse 82   Temp 97.9 F (36.6 C) (Oral)   Ht 5\' 5"  (1.651 m)   Wt 205 lb 12.8 oz (93.4 kg)   SpO2 98%   BMI 34.25 kg/m   BP Readings from Last 3 Encounters:  05/06/22 120/72  10/05/21 128/76  12/29/20 132/75    Wt Readings from Last 3 Encounters:  05/06/22 205 lb 12.8 oz (93.4 kg)  10/05/21 213 lb 3.2 oz (96.7 kg)  12/29/20 189 lb (85.7 kg)    General appearance: alert, cooperative and appears stated age Ears: normal TM's  and external ear canals both ears Throat: lips, mucosa, and tongue normal; teeth and gums normal Neck: no adenopathy, no carotid bruit, supple, symmetrical, trachea midline and thyroid not enlarged, symmetric, no tenderness/mass/nodules Back: symmetric, no curvature. ROM normal. No CVA tenderness. Lungs: clear to auscultation bilaterally Heart: regular rate and rhythm, S1, S2 normal, no murmur, click, rub or gallop Abdomen: soft, non-tender; bowel sounds normal; no masses,  no organomegaly Pulses: 2+ and symmetric Skin: Skin color, texture, turgor normal. No rashes or lesions Lymph nodes: Cervical, supraclavicular, and axillary nodes normal.  Lab Results  Component Value Date   HGBA1C 5.6 10/05/2021   HGBA1C 5.0 10/03/2020   HGBA1C 5.4 05/01/2019    Lab Results  Component Value Date   CREATININE 0.73 05/06/2022   CREATININE 0.73 10/05/2021   CREATININE 0.95 10/03/2020    Lab Results  Component Value Date   WBC 9.7 10/05/2021   HGB 14.0 10/05/2021   HCT 42.4 10/05/2021   PLT 274.0 10/05/2021   GLUCOSE 79 05/06/2022   CHOL 168 10/05/2021   TRIG 155.0 (H) 10/05/2021   HDL 73.50 10/05/2021   LDLCALC 63 10/05/2021   ALT 16 05/06/2022   AST 17 05/06/2022   NA 139 05/06/2022   K 4.2 05/06/2022   CL 104 05/06/2022   CREATININE 0.73 05/06/2022   BUN 17 05/06/2022   CO2 26 05/06/2022   TSH 1.39 10/05/2021   HGBA1C 5.6 10/05/2021    No results found.  Assessment & Plan:   Problem List Items Addressed This Visit     Obesity (BMI 30-39.9)    She is losing weight with Ozempic at the lowest dose prescribed by a weight loss clinician   Her Personal best was 176 3 years after her bariatric surgery.  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        Other Visit Diagnoses     Long-term use of high-risk medication    -  Primary   Relevant Orders   Comprehensive metabolic panel (Completed)        Follow-up: No follow-ups on file.   14/09/2021, MD

## 2022-05-07 ENCOUNTER — Encounter: Payer: Self-pay | Admitting: Internal Medicine

## 2022-05-07 NOTE — Progress Notes (Incomplete)
Subjective:  Patient ID: Candice Vaughn, female    DOB: [birth year]  Age: 51 y.o. MRN: [id number]  CC: There were no encounter diagnoses.   HPI Candice Vaughn presents for follow up on multiple issue s Chief Complaint  Patient presents with  . Follow-up    6 month follow up   1) obesity : started Ozempic  at the 0.25 mg weekly dose  5 weeks ago (prescribed by weight loss doctor Dr Tresa Res) and has lost 9 lbs,  tolerating it well,  has some diarrhea on day of injection  . Still losing weight  . Still taking wellbutrin /naloxone.  2) breast hypertrophy:   she continues to suffer from shoulder ridges, back pain and has recurrent candida rashes  under breasts . She is requesting  refill on nystatin powder   3) ADD:  managed with Vyvanse.  Forgets to take it several days per week    Outpatient Medications Prior to Visit  Medication Sig Dispense Refill  . ALPRAZolam (XANAX) 0.5 MG tablet TAKE 1 TABLET BY MOUTH EVERY DAY AT BEDTIME AS NEEDED 30 tablet 1  . buPROPion (WELLBUTRIN XL) 300 MG 24 hr tablet Take 1 tablet (300 mg total) by mouth daily. 90 tablet 1  . cyclobenzaprine (FLEXERIL) 5 MG tablet TAKE 1 TABLET BY MOUTH THREE TIMES A DAY AS NEEDED FOR MUSCLE SPASMS (Patient taking differently: rarely) 90 tablet 1  . EPINEPHrine 0.3 mg/0.3 mL IJ SOAJ injection Inject 0.3 mg into the muscle as needed for anaphylaxis. 1 each 2  . lisdexamfetamine (VYVANSE) 20 MG capsule Take 1 capsule (20 mg total) by mouth daily. 30 capsule 0  . lisdexamfetamine (VYVANSE) 20 MG capsule Take 1 capsule (20 mg total) by mouth daily. 30 capsule 0  . lisdexamfetamine (VYVANSE) 20 MG capsule Take 1 capsule (20 mg total) by mouth daily. 30 capsule 0  . lisdexamfetamine (VYVANSE) 20 MG capsule Take 1 capsule (20 mg total) by mouth daily. 30 capsule 0  . metroNIDAZOLE (METROGEL) 1 % gel Apply 1 application topically daily. 45 g 1  . naltrexone (DEPADE) 50 MG tablet Take 1 tablet (50 mg total) by mouth daily. 90  tablet 1  . rizatriptan (MAXALT) 10 MG tablet TAKE 1 TABLET BY MOUTH AS NEEDED MAY REPEAT IN 2 HOURS IF NEEDED 10 tablet 2  . naloxone (NARCAN) nasal spray 4 mg/0.1 mL 1 spray daily.    Marland Kitchen buPROPion (WELLBUTRIN XL) 300 MG 24 hr tablet TAKE 1 TABLET BY MOUTH EVERY DAY (Patient not taking: Reported on 05/06/2022) 90 tablet 1  . predniSONE (DELTASONE) 10 MG tablet 6 tablets on Day 1 , then reduce by 1 tablet daily until gone (Patient not taking: Reported on 05/06/2022) 21 tablet 0  . Vitamin D, Ergocalciferol, (DRISDOL) 1.25 MG (50000 UNIT) CAPS capsule TAKE 1 CAPSULE BY MOUTH ONE TIME PER WEEK (Patient not taking: Reported on 05/06/2022) 12 capsule 0   No facility-administered medications prior to visit.    Review of Systems;  Patient denies headache, fevers, malaise, unintentional weight loss, skin rash, eye pain, sinus congestion and sinus pain, sore throat, dysphagia,  hemoptysis , cough, dyspnea, wheezing, chest pain, palpitations, orthopnea, edema, abdominal pain, nausea, melena, diarrhea, constipation, flank pain, dysuria, hematuria, urinary  Frequency, nocturia, numbness, tingling, seizures,  Focal weakness, Loss of consciousness,  Tremor, insomnia, depression, anxiety, and suicidal ideation.      Objective:  BP 120/72 (BP Location: Left Arm, Patient Position: Sitting, Cuff Size: Normal)   Pulse 82  Temp 97.9 F (36.6 C) (Oral)   Ht 5\' 5"  (1.651 m)   Wt 205 lb 12.8 oz (93.4 kg)   SpO2 98%   BMI 34.25 kg/m   BP Readings from Last 3 Encounters:  05/06/22 120/72  10/05/21 128/76  12/29/20 132/75    Wt Readings from Last 3 Encounters:  05/06/22 205 lb 12.8 oz (93.4 kg)  10/05/21 213 lb 3.2 oz (96.7 kg)  12/29/20 189 lb (85.7 kg)    General appearance: alert, cooperative and appears stated age Ears: normal TM's and external ear canals both ears Throat: lips, mucosa, and tongue normal; teeth and gums normal Neck: no adenopathy, no carotid bruit, supple, symmetrical, trachea  midline and thyroid not enlarged, symmetric, no tenderness/mass/nodules Back: symmetric, no curvature. ROM normal. No CVA tenderness. Lungs: clear to auscultation bilaterally Heart: regular rate and rhythm, S1, S2 normal, no murmur, click, rub or gallop Abdomen: soft, non-tender; bowel sounds normal; no masses,  no organomegaly Pulses: 2+ and symmetric Skin: Skin color, texture, turgor normal. No rashes or lesions Lymph nodes: Cervical, supraclavicular, and axillary nodes normal.  Lab Results  Component Value Date   HGBA1C 5.6 10/05/2021   HGBA1C 5.0 10/03/2020   HGBA1C 5.4 05/01/2019    Lab Results  Component Value Date   CREATININE 0.73 10/05/2021   CREATININE 0.95 10/03/2020   CREATININE 0.72 05/03/2019    Lab Results  Component Value Date   WBC 9.7 10/05/2021   HGB 14.0 10/05/2021   HCT 42.4 10/05/2021   PLT 274.0 10/05/2021   GLUCOSE 84 10/05/2021   CHOL 168 10/05/2021   TRIG 155.0 (H) 10/05/2021   HDL 73.50 10/05/2021   LDLCALC 63 10/05/2021   ALT 16 10/05/2021   AST 17 10/05/2021   NA 138 10/05/2021   K 4.1 10/05/2021   CL 102 10/05/2021   CREATININE 0.73 10/05/2021   BUN 16 10/05/2021   CO2 27 10/05/2021   TSH 1.39 10/05/2021   HGBA1C 5.6 10/05/2021    No results found.  Assessment & Plan:   Problem List Items Addressed This Visit   None   I spent a total of   minutes with this patient in a face to face visit on the date of this encounter reviewing the last office visit with me on        ,  most recent with patient's cardiologist in    ,  patient'ss diet and eating habits, home blood pressure readings ,  most recent imaging study ,   and post visit ordering of testing and therapeutics.    Follow-up: No follow-ups on file.   14/09/2021, MD

## 2022-05-07 NOTE — Assessment & Plan Note (Signed)
Continue Vyvanse 20 mg daily  

## 2022-05-08 ENCOUNTER — Encounter: Payer: Self-pay | Admitting: Internal Medicine

## 2022-05-08 MED ORDER — SEMAGLUTIDE(0.25 OR 0.5MG/DOS) 2 MG/3ML ~~LOC~~ SOPN: 0.2500 mg | PEN_INJECTOR | SUBCUTANEOUS | 1 refills | Status: DC

## 2022-05-08 NOTE — Assessment & Plan Note (Signed)
She is losing weight with Ozempic at the lowest dose prescribed by a weight loss clinician   Her Personal best was 176 3 years after her bariatric surgery.  She has resumed Wellbutrin XL 300 mg and naltrexone 50 mg daily

## 2022-05-08 NOTE — Assessment & Plan Note (Signed)
Continue use of nystatin powder for rash under breasts.

## 2022-07-07 ENCOUNTER — Telehealth: Payer: Self-pay | Admitting: Internal Medicine

## 2022-07-08 NOTE — Telephone Encounter (Signed)
Refilled for 90 days.

## 2022-07-08 NOTE — Telephone Encounter (Signed)
Pt need a refill on ozempic sent to [facility] [place] 90 day supply

## 2022-07-19 ENCOUNTER — Other Ambulatory Visit: Payer: Self-pay | Admitting: Internal Medicine

## 2022-08-08 ENCOUNTER — Other Ambulatory Visit: Payer: Self-pay | Admitting: Internal Medicine

## 2022-09-13 ENCOUNTER — Other Ambulatory Visit: Payer: Self-pay | Admitting: Internal Medicine

## 2022-09-13 ENCOUNTER — Other Ambulatory Visit: Payer: Self-pay

## 2022-09-13 NOTE — Addendum Note (Signed)
Addended by: Sandy Salaam on: 09/13/2022 05:15 PM   Modules accepted: Orders

## 2022-09-13 NOTE — Telephone Encounter (Signed)
Pt requesting refill on her lisdexamfetamine (VYVANSE) 20 MG capsule to be [facility] to [facility] [place]. Pt requesting callback...Marland KitchenMarland Kitchen

## 2022-09-13 NOTE — Telephone Encounter (Signed)
Refilled: 05/06/2022 Last OV: 05/06/2022 Next OV: 11/11/2022

## 2022-09-14 MED ORDER — LISDEXAMFETAMINE DIMESYLATE 20 MG PO CAPS: 20.0000 mg | ORAL_CAPSULE | Freq: Every day | ORAL | 0 refills | Status: DC

## 2022-10-27 ENCOUNTER — Other Ambulatory Visit: Payer: Self-pay | Admitting: Internal Medicine

## 2022-11-08 ENCOUNTER — Other Ambulatory Visit (INDEPENDENT_AMBULATORY_CARE_PROVIDER_SITE_OTHER): Payer: BC Managed Care – PPO

## 2022-11-08 ENCOUNTER — Other Ambulatory Visit: Payer: BC Managed Care – PPO

## 2022-11-08 ENCOUNTER — Telehealth: Payer: Self-pay | Admitting: Internal Medicine

## 2022-11-08 DIAGNOSIS — Z79899 Other long term (current) drug therapy: Secondary | ICD-10-CM

## 2022-11-08 DIAGNOSIS — E669 Obesity, unspecified: Secondary | ICD-10-CM

## 2022-11-08 DIAGNOSIS — R7301 Impaired fasting glucose: Secondary | ICD-10-CM

## 2022-11-08 DIAGNOSIS — E785 Hyperlipidemia, unspecified: Secondary | ICD-10-CM

## 2022-11-08 NOTE — Telephone Encounter (Signed)
Pt is aware and will have her labs drawn this afternoon when she comes in.

## 2022-11-08 NOTE — Telephone Encounter (Signed)
Pt called wanting to know if she is able to come in today to get blood work done then make her appointment on 1/18 a virtual

## 2022-11-08 NOTE — Telephone Encounter (Signed)
Pt would like to see if she can have her labs done today well she is here with her daughter. I have pended lab orders for your approval.

## 2022-11-09 LAB — COMPREHENSIVE METABOLIC PANEL
ALT: 19 U/L (ref 0–35)
AST: 16 U/L (ref 0–37)
Albumin: 4.3 g/dL (ref 3.5–5.2)
Alkaline Phosphatase: 30 U/L — ABNORMAL LOW (ref 39–117)
BUN: 16 mg/dL (ref 6–23)
CO2: 21 mEq/L (ref 19–32)
Calcium: 10 mg/dL (ref 8.4–10.5)
Chloride: 104 mEq/L (ref 96–112)
Creatinine, Ser: 0.72 mg/dL (ref 0.40–1.20)
GFR: 97.55 mL/min (ref >60.00)
Glucose, Bld: 98 mg/dL (ref 70–99)
Potassium: 4 mEq/L (ref 3.5–5.1)
Sodium: 137 mEq/L (ref 135–145)
Total Bilirubin: 0.3 mg/dL (ref 0.2–1.2)
Total Protein: 6.9 g/dL (ref 6.0–8.3)

## 2022-11-09 LAB — CBC WITH DIFFERENTIAL/PLATELET
Basophils Absolute: 0.1 10*3/uL (ref 0.0–0.1)
Basophils Relative: 0.5 % (ref 0.0–3.0)
Eosinophils Absolute: 0.1 10*3/uL (ref 0.0–0.7)
Eosinophils Relative: 0.5 % (ref 0.0–5.0)
HCT: 42.1 % (ref 36.0–46.0)
Hemoglobin: 14.2 g/dL (ref 12.0–15.0)
Lymphocytes Relative: 20.2 % (ref 12.0–46.0)
Lymphs Abs: 2.4 10*3/uL (ref 0.7–4.0)
MCHC: 33.7 g/dL (ref 30.0–36.0)
MCV: 94.1 fl (ref 78.0–100.0)
Monocytes Absolute: 0.7 10*3/uL (ref 0.1–1.0)
Monocytes Relative: 5.5 % (ref 3.0–12.0)
Neutro Abs: 8.6 10*3/uL — ABNORMAL HIGH (ref 1.4–7.7)
Neutrophils Relative %: 73.3 % (ref 43.0–77.0)
Platelets: 296 10*3/uL (ref 150.0–400.0)
RBC: 4.47 Mil/uL (ref 3.87–5.11)
RDW: 13.5 % (ref 11.5–15.5)
WBC: 11.8 10*3/uL — ABNORMAL HIGH (ref 4.0–10.5)

## 2022-11-09 LAB — LIPID PANEL
Cholesterol: 195 mg/dL (ref 0–200)
HDL: 70.6 mg/dL (ref >39.00)
LDL Cholesterol: 87 mg/dL (ref 0–99)
NonHDL: 124.22
Total CHOL/HDL Ratio: 3
Triglycerides: 187 mg/dL — ABNORMAL HIGH (ref 0.0–149.0)
VLDL: 37.4 mg/dL (ref 0.0–40.0)

## 2022-11-09 LAB — HEMOGLOBIN A1C: Hgb A1c MFr Bld: 5.4 % (ref 4.6–6.5)

## 2022-11-09 LAB — TSH: TSH: 1.26 u[IU]/mL (ref 0.35–5.50)

## 2022-11-09 LAB — LDL CHOLESTEROL, DIRECT: Direct LDL: 93 mg/dL

## 2022-11-11 ENCOUNTER — Telehealth: Payer: BC Managed Care – PPO | Admitting: Internal Medicine

## 2022-11-11 ENCOUNTER — Telehealth: Payer: Self-pay

## 2022-11-11 ENCOUNTER — Encounter: Payer: Self-pay | Admitting: Internal Medicine

## 2022-11-11 VITALS — BP 120/80 | Ht 65.0 in | Wt 198.0 lb

## 2022-11-11 DIAGNOSIS — M952 Other acquired deformity of head: Secondary | ICD-10-CM | POA: Diagnosis not present

## 2022-11-11 DIAGNOSIS — G43909 Migraine, unspecified, not intractable, without status migrainosus: Secondary | ICD-10-CM | POA: Diagnosis not present

## 2022-11-11 DIAGNOSIS — L74510 Primary focal hyperhidrosis, axilla: Secondary | ICD-10-CM

## 2022-11-11 DIAGNOSIS — E669 Obesity, unspecified: Secondary | ICD-10-CM | POA: Diagnosis not present

## 2022-11-11 DIAGNOSIS — F909 Attention-deficit hyperactivity disorder, unspecified type: Secondary | ICD-10-CM

## 2022-11-11 DIAGNOSIS — Z9884 Bariatric surgery status: Secondary | ICD-10-CM

## 2022-11-11 MED ORDER — OZEMPIC (0.25 OR 0.5 MG/DOSE) 2 MG/3ML ~~LOC~~ SOPN: PEN_INJECTOR | SUBCUTANEOUS | 0 refills | Status: DC

## 2022-11-11 MED ORDER — DILTIAZEM HCL ER 120 MG PO CP24: 120.0000 mg | ORAL_CAPSULE | Freq: Every day | ORAL | 2 refills | Status: DC

## 2022-11-11 NOTE — Assessment & Plan Note (Addendum)
She is losing weight with Ozempic at the lowest dose prescribed by a weight loss clinician   Her Personal best was 176  in 2020, achieved after undergoing bariatric surgery. With weight regain to 213 lbs.  She has resumed Wellbutrin XL 300 mg and naltrexone 50 mg daily as well and is currently at 198 lbs.  Encouraged her to make lifestyle changes, specifically engage in regular aerobic exercise.

## 2022-11-11 NOTE — Progress Notes (Signed)
Virtual Visit via Caregility   Note   This format is felt to be most appropriate for this patient at this time.  All issues noted in this document were discussed and addressed.  No physical exam was performed (except for noted visual exam findings with Video Visits).   I connected with Candice Vaughn on 11/11/22 at 11:30 AM EST by a video enabled telemedicine application and verified that I am speaking with the correct person using two identifiers. Location patient: hotel room in [place], [place] Location provider: work or home office Persons participating in the virtual visit: patient, provider  I discussed the limitations, risks, security and privacy concerns of performing an evaluation and management service by telephone and the availability of in person appointments. I also discussed with the patient that there may be a patient responsible charge related to this service. The patient expressed understanding and agreed to proceed.   Reason for visit: migraine headaches, obesity and ADHD management   HPI:  52 yr old female with  a  history of migraine headaches presents with increase in frequency and severity of headaches for the past 3-4 months..  has been averaging 3 per week, and has had 4 unexcused absences over the last 4 months due to photophobia and nausea.  The headaches have been occurring without any pattern related to hormonal changes, and are occurring behind the right eye and occasionally the left eye. She has made an appt wo see a neurologist at [facility] for Botox injections but the appointment is not until July   She has had previous trials of metoprolol for prophylaxis but has not tolerated the medication due to fatigue. Topiramate also was not tolerated due to adverse effects.  No prior trial of diltiazem  2)  obesity:  she has had a weight regain after her bariatric surgery in 2020, now using ozempic at low dose for appetite suppression with good results. she has achieved a  lb weight loss  since last visit,  using a dose between 0.25 and 0.5 mg weekly.  She has not made any lifestyle changes ,yet, including regular participation in exercise , but has joined a gym and states that she has committed herself to regular routine of exercise with a friend    ROS: See pertinent positives and negatives per HPI.  Past Medical History:  Diagnosis Date   Attention deficit disorder of adult with hyperactivity 2012   diagnosed and treated  Oct 2012   Complication of anesthesia    woke up at end of surgery   COVID-19 11/16/2020   Home test.  Had symptoms.   Migraine syndrome    once a month/ mild migraines   Ureteric stone 06/20/2014    Past Surgical History:  Procedure Laterality Date   ABDOMINAL HYSTERECTOMY  jan 2012   endometriosis, inflammation, failed ablation   APPENDECTOMY     teenager   CESAREAN SECTION     X 2   COLONOSCOPY WITH PROPOFOL N/A 12/29/2020   Procedure: COLONOSCOPY WITH PROPOFOL;  Surgeon: Pasty Spillers, MD;  Location: [facility];  Service: Endoscopy;  Laterality: N/A;  priority 4 had covid bringing paper   LAPAROSCOPIC GASTRIC SLEEVE RESECTION  2016   [facility]   LIPOMA RESECTION     left axilla, [facility]    Family History  Problem Relation Age of Onset   Hypothyroidism Maternal Aunt    Hypothyroidism Maternal Grandmother    Cancer Neg Hx 38       thyroid ca  Hypothyroidism Mother     SOCIAL HX:  reports that she has never smoked. She has never used smokeless tobacco. She reports that she does not currently use alcohol. She reports that she does not use drugs.    Current Outpatient Medications:    ALPRAZolam (XANAX) 0.5 MG tablet, TAKE 1 TABLET BY MOUTH EVERY DAY AT BEDTIME AS NEEDED, Disp: 30 tablet, Rfl: 0   buPROPion (WELLBUTRIN XL) 300 MG 24 hr tablet, Take 1 tablet (300 mg total) by mouth daily., Disp: 90 tablet, Rfl: 1   cyclobenzaprine (FLEXERIL) 5 MG tablet, TAKE 1 TABLET BY MOUTH THREE TIMES A DAY AS NEEDED FOR MUSCLE SPASMS  (Patient taking differently: rarely), Disp: 90 tablet, Rfl: 1   diltiazem (DILACOR XR) 120 MG 24 hr capsule, Take 1 capsule (120 mg total) by mouth daily., Disp: 30 capsule, Rfl: 2   EPINEPHrine 0.3 mg/0.3 mL IJ SOAJ injection, Inject 0.3 mg into the muscle as needed for anaphylaxis., Disp: 1 each, Rfl: 2   HYDROcodone-acetaminophen (NORCO/VICODIN) 5-325 MG tablet, Take 1 tablet by mouth every 6 (six) hours as needed., Disp: 28 tablet, Rfl: 0   lisdexamfetamine (VYVANSE) 20 MG capsule, Take 1 capsule (20 mg total) by mouth daily., Disp: 30 capsule, Rfl: 0   lisdexamfetamine (VYVANSE) 20 MG capsule, Take 1 capsule (20 mg total) by mouth daily., Disp: 30 capsule, Rfl: 0   lisdexamfetamine (VYVANSE) 20 MG capsule, Take 1 capsule (20 mg total) by mouth daily., Disp: 90 capsule, Rfl: 0   lisdexamfetamine (VYVANSE) 20 MG capsule, Take 1 capsule (20 mg total) by mouth daily., Disp: 30 capsule, Rfl: 0   metroNIDAZOLE (METROGEL) 1 % gel, Apply 1 application topically daily., Disp: 45 g, Rfl: 1   naloxone (NARCAN) nasal spray 4 mg/0.1 mL, 1 spray daily., Disp: , Rfl:    naltrexone (DEPADE) 50 MG tablet, TAKE 1 TABLET BY MOUTH EVERY DAY, Disp: 90 tablet, Rfl: 1   nystatin (MYCOSTATIN/NYSTOP) powder, Apply 1 Application topically 2 (two) times daily. To rash until resolved., Disp: 15 g, Rfl: 3   ondansetron (ZOFRAN-ODT) 4 MG disintegrating tablet, TAKE 1 TABLET BY MOUTH EVERY 8 HOURS AS NEEDED FOR NAUSEA AND VOMITING, Disp: 20 tablet, Rfl: 0   rizatriptan (MAXALT) 10 MG tablet, TAKE 1 TABLET BY MOUTH AS NEEDED MAY REPEAT IN 2 HOURS IF NEEDED, Disp: 10 tablet, Rfl: 2   Semaglutide,0.25 or 0.5MG /DOS, (OZEMPIC, 0.25 OR 0.5 MG/DOSE,) 2 MG/3ML SOPN, INJECT 0.25MG  UNDER THE SKIN WEEKLY FOR 4 WEEKS, AND INJECT 0.5MG  WEEKLY FOR REMAINDER, Disp: 9 mL, Rfl: 0  EXAM:  VITALS per patient if applicable:  GENERAL: alert, oriented, appears well and in no acute distress  HEENT: atraumatic, conjunttiva clear, no obvious  abnormalities on inspection of external nose and ears  NECK: normal movements of the head and neck  LUNGS: on inspection no signs of respiratory distress, breathing rate appears normal, no obvious gross SOB, gasping or wheezing  CV: no obvious cyanosis  MS: moves all visible extremities without noticeable abnormality  PSYCH/NEURO: pleasant and cooperative, no obvious depression or anxiety, speech and thought processing grossly intact  ASSESSMENT AND PLAN: Obesity (BMI 30-39.9) Assessment & Plan: She is losing weight with Ozempic at the lowest dose prescribed by a weight loss clinician   Her Personal best was 176  in 2020, achieved after undergoing bariatric surgery. With weight regain to 213 lbs.  She has resumed Wellbutrin XL 300 mg and naltrexone 50 mg daily as well and is currently at 198 lbs.  Encouraged her to make lifestyle changes, specifically engage in regular aerobic exercise.    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    Migraine syndrome Assessment & Plan: Headache occurrence has increased and she is averaging 3/week with unintended absences from work requiring FMLA paperwork to be done.  Adding diltiazem for prophylaxis. 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    Axillary hyperhidrosis Assessment & Plan: Managed with axillary Botox injections every 3 months by [facility]. Last injection Oct 2023   S/P bariatric surgery  Other orders -     dilTIAZem HCl ER; Take 1 capsule (120 mg total) by mouth daily.  Dispense: 30 capsule; Refill:  2 -     Ozempic (0.25 or 0.5 MG/DOSE); INJECT 0.25MG  UNDER THE SKIN WEEKLY FOR 4 WEEKS, AND INJECT 0.5MG  WEEKLY FOR REMAINDER  Dispense: 9 mL; Refill: 0      I discussed the assessment and treatment plan with the patient. The patient was provided an opportunity to ask questions and all were answered. The patient agreed with the plan and demonstrated an understanding of the instructions.   The patient was advised to call back or seek an in-person evaluation if the symptoms worsen or if the condition fails to improve as anticipated.   I spent 30 minutes dedicated to the care of this patient on the date of this encounter to include pre-visit review of her medical history,  recent evaluation by plastic surgeon, Face-to-face time with the patient , and post visit ordering of testing and therapeutics.    Crecencio Mc, MD

## 2022-11-11 NOTE — Telephone Encounter (Signed)
FMLA paperwork has been placed in the red folder.

## 2022-11-13 DIAGNOSIS — M952 Other acquired deformity of head: Secondary | ICD-10-CM | POA: Insufficient documentation

## 2022-11-13 NOTE — Assessment & Plan Note (Signed)
Headache occurrence has increased and she is averaging 3/week with unintended absences from work requiring FMLA paperwork to be done.  Adding diltiazem for prophylaxis. At 120 mg daily

## 2022-11-13 NOTE — Assessment & Plan Note (Signed)
She continues to function well on a stable dose of Vyvanse 20 mg daily Refill history confirmed via [place] Controlled Substance databas, accessed by me today.Marland Kitchen

## 2022-11-13 NOTE — Assessment & Plan Note (Signed)
She has met with a Psychiatric nurse at [facility] to discussion potential correction of a mild neck deformity caused by prior injection  into her platysma,  as well as to correct several acquired changes to her  eyelids .

## 2022-11-13 NOTE — Assessment & Plan Note (Signed)
Managed with axillary Botox injections every 3 months by [facility]. Last injection Oct 2023

## 2022-11-14 DIAGNOSIS — Z0279 Encounter for issue of other medical certificate: Secondary | ICD-10-CM

## 2022-11-15 NOTE — Telephone Encounter (Signed)
Pt is aware and placed up front for pick up.

## 2022-11-18 LAB — MICROALBUMIN / CREATININE URINE RATIO
Creatinine,U: 223.1 mg/dL
Microalb Creat Ratio: 0.4 mg/g (ref 0.0–30.0)
Microalb, Ur: 0.8 mg/dL (ref 0.0–1.9)

## 2022-12-17 ENCOUNTER — Other Ambulatory Visit: Payer: Self-pay | Admitting: Internal Medicine

## 2023-01-24 ENCOUNTER — Other Ambulatory Visit: Payer: Self-pay | Admitting: Internal Medicine

## 2023-02-10 ENCOUNTER — Other Ambulatory Visit: Payer: Self-pay | Admitting: Internal Medicine

## 2023-03-04 LAB — HM MAMMOGRAPHY

## 2023-03-07 ENCOUNTER — Encounter: Payer: BC Managed Care – PPO | Admitting: Internal Medicine

## 2023-03-07 ENCOUNTER — Ambulatory Visit (INDEPENDENT_AMBULATORY_CARE_PROVIDER_SITE_OTHER): Payer: BC Managed Care – PPO | Admitting: Internal Medicine

## 2023-03-07 ENCOUNTER — Encounter: Payer: Self-pay | Admitting: Internal Medicine

## 2023-03-07 VITALS — BP 122/60 | HR 98 | Temp 98.1°F | Ht 65.0 in | Wt 200.2 lb

## 2023-03-07 DIAGNOSIS — E669 Obesity, unspecified: Secondary | ICD-10-CM | POA: Diagnosis not present

## 2023-03-07 DIAGNOSIS — Z9884 Bariatric surgery status: Secondary | ICD-10-CM | POA: Diagnosis not present

## 2023-03-07 DIAGNOSIS — Z0000 Encounter for general adult medical examination without abnormal findings: Secondary | ICD-10-CM

## 2023-03-07 DIAGNOSIS — G43909 Migraine, unspecified, not intractable, without status migrainosus: Secondary | ICD-10-CM

## 2023-03-07 DIAGNOSIS — N951 Menopausal and female climacteric states: Secondary | ICD-10-CM

## 2023-03-07 MED ORDER — SEMAGLUTIDE (2 MG/DOSE) 8 MG/3ML ~~LOC~~ SOPN
2.0000 mg | PEN_INJECTOR | SUBCUTANEOUS | 2 refills | Status: DC
Start: 2023-03-07 — End: 2023-06-28

## 2023-03-07 NOTE — Assessment & Plan Note (Signed)
Headache occurrence had increased and she  was  averaging 3/week with unintended absences from work requiring FMLA paperwork to be done.  She has stopped taking diltiazem for prophylaxis due to recurrent dizziness at lowest once daily dose of  120 mg daily

## 2023-03-07 NOTE — Patient Instructions (Addendum)
The next dose increase in ozempic is to 1 mg weekly,  however we can save you $$$ by prescribing the pen that delivers the 2 mg weekly dose because OZEMPIC PENS CAN BE MANIPULATED INTO DELIVERING LOWER DOSES BY "COUNTING CLICKS"  IF YOU ARE USING THE 8 MG PEN (GOLD COLOR) THAT DELIVERS THE 2 MG WEEKLY DOSE  10 CLICKS  gives you the  0.25 MG DOSE 19 CLICKs            0.50 MG DOSE 37 CLICKS           1.0 MG DOSE 56 CLICKS           1.5 MG DOSE    Here is an Alternative pharmacy if it becomes too expensive:   http://lester.info/   This website  offers discounts for ozempic.   EXERCISE HAS TO BE PART OF YOUR LIFESTYLE   REFERRAL TO DR [facility] FOR MIGRAINE MANAGEMENT

## 2023-03-07 NOTE — Progress Notes (Unsigned)
Patient ID: Candice Vaughn, female    DOB: February 18, 1971  Age: 52 y.o. MRN: [id number]  The patient is here for annual preventive examination and management of other chronic and acute problems.   The risk factors are reflected in the social history.   The roster of all physicians providing medical care to patient - is listed in the Snapshot section of the chart.   Activities of daily living:  The patient is 100% independent in all ADLs: dressing, toileting, feeding as well as independent mobility   Home safety : The patient has smoke detectors in the home. They wear seatbelts.  There are no unsecured firearms at home. There is no violence in the home.    There is no risks for hepatitis, STDs or HIV. There is no   history of blood transfusion. They have no travel history to infectious disease endemic areas of the world.   The patient has seen their dentist in the last six month. They have seen their eye doctor in the last year. The patinet  denies slight hearing difficulty with regard to whispered voices and some television programs.  They have deferred audiologic testing in the last year.  They do not  have excessive sun exposure. Discussed the need for sun protection: hats, long sleeves and use of sunscreen if there is significant sun exposure.    Diet: the importance of a healthy diet is discussed. They do have a healthy diet.   The benefits of regular aerobic exercise were discussed. The patient  exercises  3 to 5 days per week  for  60 minutes.    Depression screen: there are no signs or vegative symptoms of depression- irritability, change in appetite, anhedonia, sadness/tearfullness.   The following portions of the patient's history were reviewed and updated as appropriate: allergies, current medications, past family history, past medical history,  past surgical history, past social history  and problem list.   Visual acuity was not assessed per patient preference since the patient has  regular follow up with an  ophthalmologist. Hearing and body mass index were assessed and reviewed.    During the course of the visit the patient was educated and counseled about appropriate screening and preventive services including : fall prevention , diabetes screening, nutrition counseling, colorectal cancer screening, and recommended immunizations.    Chief Complaint:  1) Migraines:  Headache occurrence had increased and she was  averaging 3/week with unintended absences from work requiring FMLA paperwork to be done.  She has stopped taking diltiazem for prophylaxis due to recurrent dizziness at lowest once daily dose of  120 mg daily   2) Obesity s/p bariatric surgery:  reached nadir of 176,  regained 34 lbs (213)  now down 13 lbs since starting  ozempic 0.5 mg weekly.  Weight loss achieved:    3) HM:  up to date on mammogram and colonoscopy.    S/p TAH.  SOME BREAST TENDERNESS       Review of Symptoms  Patient denies headache, fevers, malaise, unintentional weight loss, skin rash, eye pain, sinus congestion and sinus pain, sore throat, dysphagia,  hemoptysis , cough, dyspnea, wheezing, chest pain, palpitations, orthopnea, edema, abdominal pain, nausea, melena, diarrhea, constipation, flank pain, dysuria, hematuria, urinary  Frequency, nocturia, numbness, tingling, seizures,  Focal weakness, Loss of consciousness,  Tremor, insomnia, depression, anxiety, and suicidal ideation.    Physical Exam:  BP 122/60   Pulse 98   Temp 98.1 F (36.7 C) (Oral)   Ht 5'  5" (1.651 m)   Wt 200 lb 3.2 oz (90.8 kg)   SpO2 99%   BMI 33.32 kg/m    Physical Exam Vitals reviewed.  Constitutional:      General: She is not in acute distress.    Appearance: Normal appearance. She is normal weight. She is not ill-appearing, toxic-appearing or diaphoretic.  HENT:     Head: Normocephalic.  Eyes:     General: No scleral icterus.       Right eye: No discharge.        Left eye: No discharge.      Conjunctiva/sclera: Conjunctivae normal.  Cardiovascular:     Rate and Rhythm: Normal rate and regular rhythm.     Heart sounds: Normal heart sounds.  Pulmonary:     Effort: Pulmonary effort is normal. No respiratory distress.     Breath sounds: Normal breath sounds.  Musculoskeletal:        General: Normal range of motion.  Skin:    General: Skin is warm and dry.  Neurological:     General: No focal deficit present.     Mental Status: She is alert and oriented to person, place, and time. Mental status is at baseline.  Psychiatric:        Mood and Affect: Mood normal.        Behavior: Behavior normal.        Thought Content: Thought content normal.        Judgment: Judgment normal.    Assessment and Plan: Migraine syndrome Assessment & Plan: Headache occurrence had increased and she  was  averaging 3/week with unintended absences from work requiring FMLA paperwork has been done .  She has stopped taking diltiazem for prophylaxis due to recurrent dizziness at lowest once daily dose of  120 mg daily . Referring to neurology  Orders: -     Ambulatory referral to Neurology  Menopause syndrome Assessment & Plan: Checking FSH/LH  Orders: -     Follicle stimulating hormone; Future -     Luteinizing hormone; Future  S/P bariatric surgery -     Comprehensive metabolic panel; Future  Obesity (BMI 30-39.9) Assessment & Plan: Continue use of GLP 1 agonist.  Strongly advised to start a regular exercise program   Other orders -     Semaglutide (2 MG/DOSE); Inject 2 mg as directed once a week.  Dispense: 3 mL; Refill: 2    No follow-ups on file.  Sherlene Shams, MD

## 2023-03-08 ENCOUNTER — Other Ambulatory Visit: Payer: Self-pay | Admitting: Internal Medicine

## 2023-03-08 DIAGNOSIS — N951 Menopausal and female climacteric states: Secondary | ICD-10-CM | POA: Insufficient documentation

## 2023-03-08 NOTE — Telephone Encounter (Signed)
Medication was discontinued yesterday at appt. Is it okay to refuse?

## 2023-03-08 NOTE — Assessment & Plan Note (Signed)
Continue use of GLP 1 agonist.  Strongly advised to start a regular exercise program

## 2023-03-08 NOTE — Assessment & Plan Note (Signed)
Checking FSH/LH

## 2023-03-17 ENCOUNTER — Other Ambulatory Visit (INDEPENDENT_AMBULATORY_CARE_PROVIDER_SITE_OTHER): Payer: BC Managed Care – PPO

## 2023-03-17 DIAGNOSIS — Z9884 Bariatric surgery status: Secondary | ICD-10-CM

## 2023-03-17 DIAGNOSIS — N951 Menopausal and female climacteric states: Secondary | ICD-10-CM | POA: Diagnosis not present

## 2023-03-17 LAB — COMPREHENSIVE METABOLIC PANEL
ALT: 16 U/L (ref 0–35)
AST: 20 U/L (ref 0–37)
Albumin: 3.8 g/dL (ref 3.5–5.2)
Alkaline Phosphatase: 26 U/L — ABNORMAL LOW (ref 39–117)
BUN: 17 mg/dL (ref 6–23)
CO2: 27 mEq/L (ref 19–32)
Calcium: 9 mg/dL (ref 8.4–10.5)
Chloride: 103 mEq/L (ref 96–112)
Creatinine, Ser: 0.73 mg/dL (ref 0.40–1.20)
GFR: 95.71 mL/min (ref >60.00)
Glucose, Bld: 92 mg/dL (ref 70–99)
Potassium: 4.3 mEq/L (ref 3.5–5.1)
Sodium: 137 mEq/L (ref 135–145)
Total Bilirubin: 0.5 mg/dL (ref 0.2–1.2)
Total Protein: 6.6 g/dL (ref 6.0–8.3)

## 2023-03-17 LAB — LUTEINIZING HORMONE: LH: 4.7 m[IU]/mL

## 2023-03-17 LAB — FOLLICLE STIMULATING HORMONE: FSH: 11.2 m[IU]/mL

## 2023-04-12 ENCOUNTER — Other Ambulatory Visit: Payer: Self-pay | Admitting: Physician Assistant

## 2023-04-12 DIAGNOSIS — R519 Headache, unspecified: Secondary | ICD-10-CM

## 2023-04-29 ENCOUNTER — Ambulatory Visit
Admission: RE | Admit: 2023-04-29 | Discharge: 2023-04-29 | Disposition: A | Discharge status: Home / Self Care | Payer: BC Managed Care – PPO | Source: Ambulatory Visit | Attending: Physician Assistant | Admitting: Physician Assistant

## 2023-04-29 DIAGNOSIS — R519 Headache, unspecified: Secondary | ICD-10-CM

## 2023-06-23 ENCOUNTER — Telehealth: Payer: Self-pay | Admitting: Pharmacy Technician

## 2023-06-23 ENCOUNTER — Other Ambulatory Visit (HOSPITAL_COMMUNITY): Payer: Self-pay

## 2023-06-23 NOTE — Telephone Encounter (Signed)
Pharmacy Patient Advocate Encounter   Received notification from CoverMyMeds that prior authorization for ozempic is required/requested.   Insurance verification completed.   The patient is insured through  [employer]  .   Per test claim: PA required; PA submitted to [employer] via CoverMyMeds Key/confirmation #/EOC [id number] Status is pending

## 2023-06-28 ENCOUNTER — Other Ambulatory Visit: Payer: Self-pay | Admitting: Internal Medicine

## 2023-06-28 ENCOUNTER — Other Ambulatory Visit (HOSPITAL_COMMUNITY): Payer: Self-pay

## 2023-06-28 NOTE — Telephone Encounter (Signed)
Ozempic has been denied

## 2023-06-28 NOTE — Telephone Encounter (Signed)
Pharmacy Patient Advocate Encounter  Received notification from [facility] that Prior Authorization for ozempic has been DENIED.  Full denial letter will be uploaded to the media tab. See denial reason below.   PA #/Case ID/Reference #: [id number]

## 2023-06-29 NOTE — Telephone Encounter (Signed)
Pt returned Candice Vaughn CMA call. Note below was read to her. Pt still would like to know why it was denied. Note below from insurance was read to her. Pt would like a call back to clarify better the denial.

## 2023-06-29 NOTE — Telephone Encounter (Signed)
Lvm informing pt of denial of ozempic

## 2023-07-03 ENCOUNTER — Encounter: Payer: Self-pay | Admitting: Internal Medicine

## 2023-07-03 DIAGNOSIS — E669 Obesity, unspecified: Secondary | ICD-10-CM

## 2023-07-04 NOTE — Telephone Encounter (Signed)
See my chart message

## 2023-07-07 MED ORDER — WEGOVY 1 MG/0.5ML ~~LOC~~ SOAJ: 1.0000 mg | SUBCUTANEOUS | 3 refills | Status: DC

## 2023-07-07 NOTE — Addendum Note (Signed)
Addended by: Sherlene Shams on: 07/07/2023 08:30 AM   Modules accepted: Orders

## 2023-07-07 NOTE — Assessment & Plan Note (Signed)
LOSING WEIGHT ON 1 MG OZEMPIC BUT INSURANCE HAS STOPPED PAYING,  WILL SEND WEGOVY

## 2023-07-27 ENCOUNTER — Other Ambulatory Visit: Payer: Self-pay | Admitting: Internal Medicine

## 2023-09-20 ENCOUNTER — Telehealth: Payer: Self-pay

## 2023-09-20 NOTE — Telephone Encounter (Signed)
Disregard. Opened in error

## 2023-10-25 ENCOUNTER — Encounter: Payer: Self-pay | Admitting: Internal Medicine

## 2023-10-25 ENCOUNTER — Telehealth: Payer: Self-pay | Admitting: Internal Medicine

## 2023-10-25 ENCOUNTER — Telehealth: Payer: Self-pay

## 2023-10-25 VITALS — BP 130/80 | Ht 65.0 in | Wt 206.0 lb

## 2023-10-25 DIAGNOSIS — F909 Attention-deficit hyperactivity disorder, unspecified type: Secondary | ICD-10-CM

## 2023-10-25 DIAGNOSIS — H04129 Dry eye syndrome of unspecified lacrimal gland: Secondary | ICD-10-CM

## 2023-10-25 DIAGNOSIS — E669 Obesity, unspecified: Secondary | ICD-10-CM

## 2023-10-25 DIAGNOSIS — R7301 Impaired fasting glucose: Secondary | ICD-10-CM

## 2023-10-25 DIAGNOSIS — E785 Hyperlipidemia, unspecified: Secondary | ICD-10-CM | POA: Diagnosis not present

## 2023-10-25 DIAGNOSIS — G43909 Migraine, unspecified, not intractable, without status migrainosus: Secondary | ICD-10-CM

## 2023-10-25 MED ORDER — ONDANSETRON 4 MG PO TBDP
4.0000 mg | ORAL_TABLET | Freq: Three times a day (TID) | ORAL | 2 refills | Status: AC | PRN
Start: 2023-10-25 — End: ?

## 2023-10-25 MED ORDER — METRONIDAZOLE 1 % EX GEL: 1.0000 | Freq: Every day | CUTANEOUS | 1 refills | Status: DC

## 2023-10-25 MED ORDER — LISDEXAMFETAMINE DIMESYLATE 20 MG PO CAPS: 20.0000 mg | ORAL_CAPSULE | Freq: Every day | ORAL | 0 refills | Status: DC

## 2023-10-25 NOTE — Telephone Encounter (Signed)
I left voicemail for patient asking her to please call us back.  When patient calls back, we need to collect her copay for her virtual visit today.

## 2023-10-25 NOTE — Assessment & Plan Note (Signed)
 Present for several months,  with excessive early morning drainage which is clear.  Checking serologies for Sjogrens antibodies,  recommending  use of systane/Refresh frequently during the day,  a more viscous topical moisturizing eye drop bat night (eg , Blink)  and see ophthalmology if persistent

## 2023-10-25 NOTE — Progress Notes (Addendum)
 Virtual Visit via Caregility   Note   This format is felt to be most appropriate for this patient at this time.  All issues noted in this document were discussed and addressed.  No physical exam was performed (except for noted visual exam findings with Video Visits).   I connected with  Candice Vaughn  on 10/25/23 at 10:00 AM EST by a video enabled telemedicine application  and verified that I am speaking with the correct person using two identifiers. Location patient: home Location provider: work or home office Persons participating in the virtual visit: patient, provider  I discussed the limitations, risks, security and privacy concerns of performing an evaluation and management service by telephone and the availability of in person appointments. I also discussed with the patient that there may be a patient responsible charge related to this service. The patient expressed understanding and agreed to proceed.  Reason for visit: FMLA renewal,  dry eye  HPI:  Candice Vaughn is a 52 yr old [employer] with a history of recurrent migraines, intolerant of preventive medications due to recurrent dizziness (referred to neurology in May 2024) who has been referred to [facility] neurologist [facility] for Botox injections.  She is still waiting for approval  and is averaging 3 migraines per week.  Most are aborted with maxalt ,  but once every month or so she has one that is incapacitating and requires her to stay home due to severe photophobia and nausea/vomiting.    2) Dry eye: ; eyes water  excessvely in the morning upon waking.   Has 8-10 hours of screenwork daily and notes that eyes feel dry throught the day and at times gritty.  Denies conjucitivitis,  cslceral redness and pain .   3) ADD: using Vyvanse  .  Tolerating meds    ROS: See pertinent positives and negatives per HPI.  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  Had symptoms.   Migraine syndrome    once a month/ mild migraines   Panniculitis 08/15/2017   Ureteric stone 06/20/2014    Past Surgical History:  Procedure Laterality Date   ABDOMINAL HYSTERECTOMY  jan 2012   endometriosis, inflammation, failed ablation   APPENDECTOMY     teenager   CESAREAN SECTION     X 2   COLONOSCOPY WITH PROPOFOL  N/A 12/29/2020   Procedure: COLONOSCOPY WITH PROPOFOL ;  Surgeon: Janalyn Keene NOVAK, MD;  Location: [facility];  Service: Endoscopy;  Laterality: N/A;  priority 4 had covid bringing paper   LAPAROSCOPIC GASTRIC SLEEVE RESECTION  2016   [facility]   LIPOMA RESECTION     left axilla, [facility]    Family History  Problem Relation Age of Onset   Hypothyroidism Maternal Aunt    Hypothyroidism Maternal Grandmother    Cancer Neg Hx 38       thyroid  ca   Hypothyroidism Mother     SOCIAL HX:  reports that she has never smoked. She has never used smokeless tobacco. She reports that she does not currently use alcohol. She reports that she does not use drugs.    Current Outpatient Medications:    ALPRAZolam  (XANAX ) 0.5 MG tablet, TAKE 1 TABLET BY MOUTH EVERY DAY AT BEDTIME AS NEEDED, Disp: 30 tablet, Rfl: 4   buPROPion  (WELLBUTRIN  XL) 300  MG 24 hr tablet, TAKE 1 TABLET BY MOUTH EVERY DAY, Disp: 90 tablet, Rfl: 1   cyclobenzaprine  (FLEXERIL ) 5 MG tablet, TAKE 1 TABLET BY MOUTH THREE TIMES A DAY AS NEEDED FOR MUSCLE SPASMS (Patient taking differently: No sig reported), Disp: 90 tablet, Rfl: 1   EPINEPHrine  0.3 mg/0.3 mL IJ SOAJ injection, Inject 0.3 mg into the muscle as needed for anaphylaxis., Disp: 1 each, Rfl: 2   HYDROcodone -acetaminophen  (NORCO/VICODIN) 5-325 MG tablet, Take 1 tablet by mouth every 6 (six) hours as needed., Disp: 28 tablet, Rfl: 0   naloxone (NARCAN) nasal spray 4 mg/0.1 mL, 1 spray daily., Disp: , Rfl:    naltrexone  (DEPADE) 50 MG tablet,  TAKE 1 TABLET BY MOUTH EVERY DAY, Disp: 90 tablet, Rfl: 1   nystatin  (MYCOSTATIN /NYSTOP ) powder, Apply 1 Application topically 2 (two) times daily. To rash until resolved., Disp: 15 g, Rfl: 3   rizatriptan  (MAXALT ) 10 MG tablet, TAKE 1 TABLET BY MOUTH AS NEEDED MAY REPEAT IN 2 HOURS IF NEEDED, Disp: 10 tablet, Rfl: 2   lisdexamfetamine (VYVANSE ) 20 MG capsule, Take 1 capsule (20 mg total) by mouth daily., Disp: 90 capsule, Rfl: 0   metroNIDAZOLE  (METROGEL ) 1 % gel, Apply 1 Application topically daily., Disp: 45 g, Rfl: 1   ondansetron  (ZOFRAN -ODT) 4 MG disintegrating tablet, Take 1 tablet (4 mg total) by mouth every 8 (eight) hours as needed for nausea or vomiting., Disp: 20 tablet, Rfl: 2  EXAM:  VITALS per patient if applicable:  GENERAL: alert, oriented, appears well and in no acute distress  HEENT: atraumatic, conjunttiva clear, no obvious abnormalities on inspection of external nose and ears  NECK: normal movements of the head and neck  LUNGS: on inspection no signs of respiratory distress, breathing rate appears normal, no obvious gross SOB, gasping or wheezing  CV: no obvious cyanosis  MS: moves all visible extremities without noticeable abnormality  PSYCH/NEURO: pleasant and cooperative, no obvious depression or anxiety, speech and thought processing grossly intact  ASSESSMENT AND PLAN: Obesity (BMI 30-39.9) -     CBC with Differential/Platelet; Future -     TSH; Future  Hyperlipidemia, unspecified hyperlipidemia type -     Lipid panel; Future -     LDL cholesterol, direct; Future  Impaired fasting glucose -     Comprehensive metabolic panel; Future -     Hemoglobin A1c; Future -     Microalbumin / creatinine urine ratio; Future  Dry eye Assessment & Plan: Present for several months,  with excessive early morning drainage which is clear.  Checking serologies for Sjogrens antibodies,  recommending  use of systane/Refresh frequently during the day,  a more viscous  topical moisturizing eye drop bat night (eg , Blink)  and see ophthalmology if persistent   Orders: -     Sjogrens syndrome-A extractable nuclear antibody; Future -     Sjogrens syndrome-B extractable nuclear antibody; Future  Migraine syndrome Assessment & Plan: Headache occurrence has remained at a frequency of 3/week,  resulting in an average of 1  unintended absences from work rper [employer] paperwork has been done .  She has stopped taking diltiazem  for prophylaxis due to recurrent dizziness at lowest once daily dose of  120 mg daily .and is awaiint insuracne approval for Botox injections which Eva Sarna, MD at [facility] has recommended .   these treatments will require OV with  neurology  every 3 months   Orders: -     Ondansetron ; Take 1 tablet (4 mg  total) by mouth every 8 (eight) hours as needed for nausea or vomiting.  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 Last 90 day refill was in July   [facility]    Other orders -     metroNIDAZOLE ; Apply 1 Application topically daily.  Dispense: 45 g; Refill: 1 -     Lisdexamfetamine Dimesylate ; Take 1 capsule (20 mg total) by mouth daily.  Dispense: 90 capsule; Refill: 0      I discussed the assessment and treatment plan with the patient. The patient was provided an opportunity to ask questions and all were answered. The patient agreed with the plan and demonstrated an understanding of the instructions.   The patient was advised to call back or seek an in-person evaluation if the symptoms worsen or if the condition fails to improve as anticipated.   I spent 30 minutes dedicated to the care of this patient on the date of this encounter to include pre-visit review of her medical history,   recent visits with Dr Lane and dr Mhoon,  Face-to-face time with the patient , and post visit ordering of testing and therapeutics.    Verneita LITTIE Kettering, MD

## 2023-10-25 NOTE — Assessment & Plan Note (Addendum)
 She continues to function well on a stable dose of Vyvanse 20 mg daily Refill history confirmed via [place] Controlled Substance databas, accessed by me today.. Last 90 day refill was in July   [facility]  [facility]

## 2023-10-25 NOTE — Telephone Encounter (Signed)
Pt called back to pay her co- pay.

## 2023-10-25 NOTE — Assessment & Plan Note (Addendum)
 Headache occurrence has remained at a frequency of 3/week,  resulting in an average of 1  unintended absences from work rper month requiring FMLA paperwork has been done .  She has stopped taking diltiazem  for prophylaxis due to recurrent dizziness at lowest once daily dose of  120 mg daily .and is awaiint insuracne approval for Botox injections which Eva Sarna, MD at [facility] has recommended .   these treatments will require OV with  neurology  every 3 months . FMLA form has been cmpleted.

## 2023-10-25 NOTE — Addendum Note (Signed)
 Addended by: Sandy Salaam on: 10/25/2023 10:44 AM   Modules accepted: Orders

## 2023-10-25 NOTE — Telephone Encounter (Signed)
 Forms have been placed in red folder for completion.

## 2023-11-01 ENCOUNTER — Encounter: Payer: Self-pay | Admitting: Internal Medicine

## 2023-11-03 NOTE — Telephone Encounter (Signed)
 Pt would like to have Zepbound sent in to see if it would be covered by her insurance.

## 2023-11-04 MED ORDER — TIRZEPATIDE-WEIGHT MANAGEMENT 5 MG/0.5ML ~~LOC~~ SOLN: 5.0000 mg | SUBCUTANEOUS | 2 refills | Status: DC

## 2023-11-04 NOTE — Telephone Encounter (Signed)
 Forms are complete and placed up front for pick up. Pt is aware.

## 2023-11-24 ENCOUNTER — Other Ambulatory Visit: Payer: BC Managed Care – PPO

## 2023-12-01 ENCOUNTER — Other Ambulatory Visit: Payer: BC Managed Care – PPO

## 2023-12-01 DIAGNOSIS — H04129 Dry eye syndrome of unspecified lacrimal gland: Secondary | ICD-10-CM

## 2023-12-01 DIAGNOSIS — R7301 Impaired fasting glucose: Secondary | ICD-10-CM

## 2023-12-01 DIAGNOSIS — E785 Hyperlipidemia, unspecified: Secondary | ICD-10-CM

## 2023-12-01 DIAGNOSIS — E669 Obesity, unspecified: Secondary | ICD-10-CM

## 2023-12-01 LAB — MICROALBUMIN / CREATININE URINE RATIO
Creatinine,U: 176.6 mg/dL
Microalb Creat Ratio: 0.5 mg/g (ref 0.0–30.0)
Microalb, Ur: 0.8 mg/dL (ref 0.0–1.9)

## 2023-12-01 LAB — CBC WITH DIFFERENTIAL/PLATELET
Basophils Absolute: 0.1 10*3/uL (ref 0.0–0.1)
Basophils Relative: 1.1 % (ref 0.0–3.0)
Eosinophils Absolute: 0.1 10*3/uL (ref 0.0–0.7)
Eosinophils Relative: 0.7 % (ref 0.0–5.0)
HCT: 43 % (ref 36.0–46.0)
Hemoglobin: 14.2 g/dL (ref 12.0–15.0)
Lymphocytes Relative: 23.6 % (ref 12.0–46.0)
Lymphs Abs: 1.7 10*3/uL (ref 0.7–4.0)
MCHC: 33.1 g/dL (ref 30.0–36.0)
MCV: 94.5 fL (ref 78.0–100.0)
Monocytes Absolute: 0.3 10*3/uL (ref 0.1–1.0)
Monocytes Relative: 4.8 % (ref 3.0–12.0)
Neutro Abs: 4.9 10*3/uL (ref 1.4–7.7)
Neutrophils Relative %: 69.8 % (ref 43.0–77.0)
Platelets: 316 10*3/uL (ref 150.0–400.0)
RBC: 4.55 Mil/uL (ref 3.87–5.11)
RDW: 12.9 % (ref 11.5–15.5)
WBC: 7 10*3/uL (ref 4.0–10.5)

## 2023-12-01 LAB — COMPREHENSIVE METABOLIC PANEL
ALT: 16 U/L (ref 0–35)
AST: 16 U/L (ref 0–37)
Albumin: 4.3 g/dL (ref 3.5–5.2)
Alkaline Phosphatase: 29 U/L — ABNORMAL LOW (ref 39–117)
BUN: 16 mg/dL (ref 6–23)
CO2: 28 meq/L (ref 19–32)
Calcium: 9.8 mg/dL (ref 8.4–10.5)
Chloride: 101 meq/L (ref 96–112)
Creatinine, Ser: 0.81 mg/dL (ref 0.40–1.20)
GFR: 84.06 mL/min (ref >60.00)
Glucose, Bld: 86 mg/dL (ref 70–99)
Potassium: 4.5 meq/L (ref 3.5–5.1)
Sodium: 138 meq/L (ref 135–145)
Total Bilirubin: 0.5 mg/dL (ref 0.2–1.2)
Total Protein: 7.2 g/dL (ref 6.0–8.3)

## 2023-12-01 LAB — LDL CHOLESTEROL, DIRECT: Direct LDL: 100 mg/dL

## 2023-12-01 LAB — LIPID PANEL
Cholesterol: 186 mg/dL (ref 0–200)
HDL: 74.7 mg/dL (ref >39.00)
LDL Cholesterol: 95 mg/dL (ref 0–99)
NonHDL: 110.87
Total CHOL/HDL Ratio: 2
Triglycerides: 79 mg/dL (ref 0.0–149.0)
VLDL: 15.8 mg/dL (ref 0.0–40.0)

## 2023-12-01 LAB — HEMOGLOBIN A1C: Hgb A1c MFr Bld: 5.5 % (ref 4.6–6.5)

## 2023-12-01 LAB — TSH: TSH: 1.09 u[IU]/mL (ref 0.35–5.50)

## 2023-12-02 LAB — SJOGRENS SYNDROME-A EXTRACTABLE NUCLEAR ANTIBODY: SSA (Ro) (ENA) Antibody, IgG: <1 AI

## 2023-12-02 LAB — SJOGRENS SYNDROME-B EXTRACTABLE NUCLEAR ANTIBODY: SSB (La) (ENA) Antibody, IgG: <1 AI

## 2023-12-04 ENCOUNTER — Encounter: Payer: Self-pay | Admitting: Internal Medicine

## 2023-12-13 ENCOUNTER — Telehealth: Payer: Self-pay | Admitting: Pharmacy Technician

## 2023-12-13 ENCOUNTER — Other Ambulatory Visit (HOSPITAL_COMMUNITY): Payer: Self-pay

## 2023-12-13 ENCOUNTER — Encounter: Payer: Self-pay | Admitting: Internal Medicine

## 2023-12-13 MED ORDER — SEMAGLUTIDE (2 MG/DOSE) 8 MG/3ML ~~LOC~~ SOPN: 2.0000 mg | PEN_INJECTOR | SUBCUTANEOUS | 2 refills | Status: AC

## 2023-12-13 NOTE — Addendum Note (Signed)
 Addended by: Sherlene Shams on: 12/13/2023 12:10 PM   Modules accepted: Orders

## 2023-12-13 NOTE — Telephone Encounter (Signed)
 Pharmacy Patient Advocate Encounter   Received notification from Fax that prior authorization for Ozempic (2 MG/DOSE) 8MG /3ML pen-injectors is required/requested.   Insurance verification completed.   The patient is insured through [facility] .   Per test claim: PA required; PA submitted to above mentioned insurance via CoverMyMeds Key/confirmation #/EOC [id number] Status is pending

## 2023-12-13 NOTE — Telephone Encounter (Signed)
 Pharmacy Patient Advocate Encounter  Received notification from [facility] that Prior Authorization for Ozempic (2 MG/DOSE) 8MG /3ML pen-injectors  has been DENIED.  Full denial letter will be uploaded to the media tab. See denial reason below.    PA #/Case ID/Reference #: [id number]

## 2023-12-17 ENCOUNTER — Other Ambulatory Visit: Payer: Self-pay | Admitting: Internal Medicine

## 2024-01-06 ENCOUNTER — Other Ambulatory Visit (HOSPITAL_COMMUNITY): Payer: Self-pay

## 2024-01-17 ENCOUNTER — Other Ambulatory Visit: Payer: Self-pay | Admitting: Internal Medicine

## 2024-01-22 ENCOUNTER — Other Ambulatory Visit: Payer: Self-pay | Admitting: Internal Medicine

## 2024-01-26 ENCOUNTER — Other Ambulatory Visit (HOSPITAL_COMMUNITY): Payer: Self-pay

## 2024-03-06 LAB — HM MAMMOGRAPHY

## 2024-03-13 ENCOUNTER — Telehealth: Payer: Self-pay

## 2024-03-13 NOTE — Telephone Encounter (Signed)
 Pharmacy Patient Advocate Encounter  Received notification from [facility] that Prior Authorization for Ozempic  (2 MG/DOSE) 8MG /3ML pen-injectors  has been CANCELLED due to previous denial on file. Please d/c medication if patient is not going to take or the patient may pay out of pocket.    PA #/Case ID/Reference #: [id number]

## 2024-03-14 ENCOUNTER — Other Ambulatory Visit (HOSPITAL_COMMUNITY): Payer: Self-pay

## 2024-03-16 ENCOUNTER — Other Ambulatory Visit (HOSPITAL_COMMUNITY): Payer: Self-pay

## 2024-03-16 NOTE — Telephone Encounter (Signed)
 Wegovy  is "covered" however patient will pay a small discounted price. Test claims show copay is still $1k+

## 2024-03-16 NOTE — Telephone Encounter (Signed)
 Pt is aware and gave a verbal understanding.

## 2024-05-04 ENCOUNTER — Other Ambulatory Visit: Payer: Self-pay | Admitting: Internal Medicine

## 2024-05-04 MED ORDER — NALTREXONE HCL 50 MG PO TABS: 50.0000 mg | ORAL_TABLET | Freq: Every day | ORAL | 1 refills | Status: DC

## 2024-06-11 ENCOUNTER — Encounter: Payer: Self-pay | Admitting: Internal Medicine

## 2024-06-12 NOTE — Telephone Encounter (Signed)
 Refilled: 10/25/2023 Last OV: 10/25/2023 Next OV: not scheduled  Mychart message has been sent to pt advising her to please give the office a call to schedule an appt since it has been over 6 months since last appt.

## 2024-06-14 MED ORDER — LISDEXAMFETAMINE DIMESYLATE 20 MG PO CAPS: 20.0000 mg | ORAL_CAPSULE | Freq: Every day | ORAL | 0 refills | Status: DC

## 2024-07-10 ENCOUNTER — Ambulatory Visit

## 2024-07-10 VITALS — BP 106/78 | HR 86 | Temp 98.4°F | Ht 65.0 in | Wt 206.2 lb

## 2024-07-10 DIAGNOSIS — F909 Attention-deficit hyperactivity disorder, unspecified type: Secondary | ICD-10-CM

## 2024-07-10 MED ORDER — LISDEXAMFETAMINE DIMESYLATE 20 MG PO CAPS: 20.0000 mg | ORAL_CAPSULE | Freq: Every day | ORAL | 0 refills | Status: AC

## 2024-07-10 MED ORDER — AMPHETAMINE-DEXTROAMPHETAMINE 5 MG PO TABS
5.0000 mg | ORAL_TABLET | Freq: Every day | ORAL | 0 refills | Status: AC | PRN
Start: 2024-08-09 — End: ?

## 2024-07-10 MED ORDER — AMPHETAMINE-DEXTROAMPHETAMINE 5 MG PO TABS: 5.0000 mg | ORAL_TABLET | Freq: Every day | ORAL | 0 refills | Status: AC | PRN

## 2024-07-10 MED ORDER — LISDEXAMFETAMINE DIMESYLATE 20 MG PO CAPS: 20.0000 mg | ORAL_CAPSULE | Freq: Every day | ORAL | 0 refills | Status: DC

## 2024-07-10 NOTE — Assessment & Plan Note (Signed)
 ADHD diagnosed 18-20 years ago with intermittent pharmacotherapy with Adderall and Vyvanse  in the past.  Restarted Vyvanse  20 mg in August/2025 but not significantly effective. - Continue Vyvanse  20 mg daily. Add Adderall 5 mg short-acting as needed daily to augment therapy. Hold off Adderall if TMJ pain reoccurs.  - Monitor for side effects, adjust dosage if necessary. - Ensure adequate hydration to reduce kidney stone risk. - Follow up with Candice Vaughn in 6-8 weeks to assess and adjust medication. - PDMP reviewed prior to sending refill for Vyvanse  and Adderall. Consider UDS and controlled substance agreement during f/u with PCP.

## 2024-07-10 NOTE — Progress Notes (Signed)
 Acute Office Visit  Subjective:    Patient ID: Candice Vaughn, female    DOB: 10/19/1971, 53 y.o.   MRN: [id number]  Chief Complaint  Patient presents with   Medication Management   HPI Discussed the use of AI scribe software for clinical note transcription with the patient, who gave verbal consent to proceed.  History of Present Illness Candice Vaughn is a 52 year old female with known diagnosis of ADHD who presents for ADHD related medication management.   She was diagnosed with ADHD approximately 18 to 20 years ago during graduate school. She currently works as a Publishing rights manager at [employer] and faces significant challenges with concentration, memory which impact her job performance. She uses strategies like reminders and seeking assistance from others to manage her symptoms.  She was previously on Adderall, which was effective but caused side effects such as jaw tightness. During the pandemic, she resumed Adderall due to increased work demands but later switched to Vyvanse  to give her body a break from Adderall. She recently restarted Vyvanse  20 mg after a nine-month hiatus, coinciding with making this appointment, but reports no significant improvement in concentration since restarting Vyvanse . She last refilled medication on 06/14/24.   She is considering returning to Adderall due to its previous effectiveness, despite the side effects. She has been managing without medication for several months, relying on sticky notes and other reminders to cope with her responsibilities at work.  She has no history of cardiac issues.   ROS As per HPI    Objective:    BP 106/78 (BP Location: Right Arm, Patient Position: Sitting, Cuff Size: Normal)   Pulse 86   Temp 98.4 F (36.9 C) (Oral)   Ht 5' 5 (1.651 m)   Wt 206 lb 3.2 oz (93.5 kg)   SpO2 99%   BMI 34.31 kg/m    Physical Exam HENT:     Head: Normocephalic and atraumatic.  Cardiovascular:     Rate and Rhythm: Normal  rate.     Pulses: Normal pulses.     Heart sounds: No murmur heard. Pulmonary:     Effort: Pulmonary effort is normal.     Breath sounds: Normal breath sounds.  Musculoskeletal:     Cervical back: Neck supple.  Lymphadenopathy:     Cervical: No cervical adenopathy.  Neurological:     Mental Status: She is alert and oriented to person, place, and time.  Psychiatric:        Mood and Affect: Mood normal.     No results found for any visits on 07/10/24.     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  Restarted Vyvanse  20 mg in August/2025 but not significantly effective. - Continue Vyvanse  20 mg daily. Add Adderall 5 mg short-acting as needed daily to augment therapy. Hold off Adderall if TMJ pain reoccurs.  - Monitor for side effects, adjust dosage if necessary. - Ensure adequate hydration to reduce kidney stone risk. - Follow up with Doctor Marylynn in 6-8 weeks to assess and adjust medication. - PDMP reviewed prior to sending refill for Vyvanse  and Adderall. Consider UDS and controlled substance agreement during f/u with PCP.   Orders: -     Amphetamine -Dextroamphetamine ; Take 1 tablet (5 mg total) by mouth daily as needed.  Dispense: 30 tablet; Refill: 0 -     Lisdexamfetamine Dimesylate ; Take 1  capsule (20 mg total) by mouth daily.  Dispense: 30 capsule; Refill: 0 -     Amphetamine -Dextroamphetamine ; Take 1 tablet (5 mg total) by mouth daily as needed.  Dispense: 30 tablet; Refill: 0 -     Lisdexamfetamine Dimesylate ; Take 1 capsule (20 mg total) by mouth daily.  Dispense: 30 capsule; Refill: 0    Return in about 8 weeks (around 09/04/2024) for 6-8 weeks for f/u on ADHD . She should have enough refill lasting till her follow up visit with PCP.   Luke Shade, MD

## 2024-07-22 ENCOUNTER — Other Ambulatory Visit: Payer: Self-pay | Admitting: Internal Medicine

## 2024-09-13 ENCOUNTER — Other Ambulatory Visit: Payer: Self-pay | Admitting: Internal Medicine

## 2024-09-18 ENCOUNTER — Ambulatory Visit: Admitting: Internal Medicine

## 2024-10-24 ENCOUNTER — Other Ambulatory Visit: Payer: Self-pay | Admitting: Internal Medicine

## 2024-10-28 ENCOUNTER — Encounter: Payer: Self-pay | Admitting: Internal Medicine

## 2024-10-30 ENCOUNTER — Encounter: Payer: Self-pay | Admitting: Internal Medicine

## 2024-10-30 ENCOUNTER — Ambulatory Visit: Admitting: Internal Medicine

## 2024-10-30 VITALS — BP 122/82 | HR 79 | Temp 98.0°F | Ht 65.0 in | Wt 207.0 lb

## 2024-10-30 DIAGNOSIS — E669 Obesity, unspecified: Secondary | ICD-10-CM | POA: Diagnosis not present

## 2024-10-30 DIAGNOSIS — Z0000 Encounter for general adult medical examination without abnormal findings: Secondary | ICD-10-CM | POA: Diagnosis not present

## 2024-10-30 DIAGNOSIS — Z6834 Body mass index (BMI) 34.0-34.9, adult: Secondary | ICD-10-CM | POA: Diagnosis not present

## 2024-10-30 DIAGNOSIS — M26609 Unspecified temporomandibular joint disorder, unspecified side: Secondary | ICD-10-CM

## 2024-10-30 DIAGNOSIS — N951 Menopausal and female climacteric states: Secondary | ICD-10-CM

## 2024-10-30 DIAGNOSIS — E785 Hyperlipidemia, unspecified: Secondary | ICD-10-CM | POA: Diagnosis not present

## 2024-10-30 DIAGNOSIS — Z780 Asymptomatic menopausal state: Secondary | ICD-10-CM

## 2024-10-30 DIAGNOSIS — R7301 Impaired fasting glucose: Secondary | ICD-10-CM | POA: Diagnosis not present

## 2024-10-30 DIAGNOSIS — L74510 Primary focal hyperhidrosis, axilla: Secondary | ICD-10-CM

## 2024-10-30 DIAGNOSIS — F909 Attention-deficit hyperactivity disorder, unspecified type: Secondary | ICD-10-CM

## 2024-10-30 DIAGNOSIS — G43909 Migraine, unspecified, not intractable, without status migrainosus: Secondary | ICD-10-CM

## 2024-10-30 MED ORDER — EPINEPHRINE 0.3 MG/0.3ML IJ SOAJ: 0.3000 mg | INTRAMUSCULAR | 0 refills | Status: AC | PRN

## 2024-10-30 MED ORDER — ALPRAZOLAM 0.5 MG PO TABS: ORAL_TABLET | ORAL | 3 refills | Status: AC

## 2024-10-30 MED ORDER — BUPROPION HCL ER (XL) 300 MG PO TB24: 300.0000 mg | ORAL_TABLET | Freq: Every day | ORAL | 1 refills | Status: AC

## 2024-10-30 MED ORDER — LISDEXAMFETAMINE DIMESYLATE 20 MG PO CAPS: 20.0000 mg | ORAL_CAPSULE | Freq: Every day | ORAL | 0 refills | Status: AC

## 2024-10-30 MED ORDER — NALTREXONE HCL 50 MG PO TABS: 50.0000 mg | ORAL_TABLET | Freq: Every day | ORAL | 1 refills | Status: AC

## 2024-10-30 NOTE — Progress Notes (Unsigned)
 "  Subjective:  Patient ID: Candice Vaughn, female    DOB: 07/12/1971  Age: 54 y.o. MRN: [id number]  CC: Diagnoses of Hyperlipidemia, unspecified hyperlipidemia type and Impaired fasting glucose were pertinent to this visit.   HPI Candice Vaughn presents for  Chief Complaint  Patient presents with   Medical Management of Chronic Issues   1) ADD:  lost to follow up .  Using vyanse and short acting adderall prn ,  last refill written in October by Dr Abbey.    Outpatient Medications Prior to Visit  Medication Sig Dispense Refill   ALPRAZolam  (XANAX ) 0.5 MG tablet TAKE 1 TABLET BY MOUTH EVERY DAY AT BEDTIME AS NEEDED 30 tablet 0   amphetamine -dextroamphetamine  (ADDERALL) 5 MG tablet Take 1 tablet (5 mg total) by mouth daily as needed. 30 tablet 0   amphetamine -dextroamphetamine  (ADDERALL) 5 MG tablet Take 1 tablet (5 mg total) by mouth daily as needed. 30 tablet 0   Atogepant (QULIPTA) 60 MG TABS Take 60 mg by mouth.     BOTOX 200 units injection Inject 200 Units into the muscle.     buPROPion  (WELLBUTRIN  XL) 300 MG 24 hr tablet TAKE 1 TABLET BY MOUTH EVERY DAY 90 tablet 1   cyclobenzaprine  (FLEXERIL ) 5 MG tablet TAKE 1 TABLET BY MOUTH THREE TIMES A DAY AS NEEDED FOR MUSCLE SPASMS (Patient taking differently: No sig reported) 90 tablet 1   EPINEPHrine  0.3 mg/0.3 mL IJ SOAJ injection Inject 0.3 mg into the muscle as needed for anaphylaxis. 1 each 2   lisdexamfetamine  (VYVANSE ) 20 MG capsule Take 1 capsule (20 mg total) by mouth daily. 30 capsule 0   lisdexamfetamine  (VYVANSE ) 20 MG capsule Take 1 capsule (20 mg total) by mouth daily. 30 capsule 0   metroNIDAZOLE  (METROGEL ) 1 % gel APPLY 1 APPLICATION TOPICALLY DAILY 60 g 1   naloxone (NARCAN) nasal spray 4 mg/0.1 mL 1 spray daily.     naltrexone  (DEPADE) 50 MG tablet Take 1 tablet (50 mg total) by mouth daily. 90 tablet 1   nystatin  (MYCOSTATIN /NYSTOP ) powder Apply 1 Application topically 2 (two) times daily. To rash until resolved. 15 g  3   ondansetron  (ZOFRAN -ODT) 4 MG disintegrating tablet Take 1 tablet (4 mg total) by mouth every 8 (eight) hours as needed for nausea or vomiting. 20 tablet 2   rizatriptan  (MAXALT ) 10 MG tablet TAKE 1 TABLET BY MOUTH AS NEEDED MAY REPEAT IN 2 HOURS IF NEEDED 10 tablet 2   Semaglutide , 2 MG/DOSE, 8 MG/3ML SOPN Inject 2 mg as directed once a week. 3 mL 2   No facility-administered medications prior to visit.    Review of Systems;  Patient denies headache, fevers, malaise, unintentional weight loss, skin rash, eye pain, sinus congestion and sinus pain, sore throat, dysphagia,  hemoptysis , cough, dyspnea, wheezing, chest pain, palpitations, orthopnea, edema, abdominal pain, nausea, melena, diarrhea, constipation, flank pain, dysuria, hematuria, urinary  Frequency, nocturia, numbness, tingling, seizures,  Focal weakness, Loss of consciousness,  Tremor, insomnia, depression, anxiety, and suicidal ideation.      Objective:  There were no vitals taken for this visit.  BP Readings from Last 3 Encounters:  07/10/24 106/78  10/25/23 130/80  03/07/23 122/60    Wt Readings from Last 3 Encounters:  07/10/24 206 lb 3.2 oz (93.5 kg)  10/25/23 206 lb (93.4 kg)  03/07/23 200 lb 3.2 oz (90.8 kg)    Physical Exam  Lab Results  Component Value Date   HGBA1C 5.5 12/01/2023   HGBA1C  5.4 11/08/2022   HGBA1C 5.6 10/05/2021    Lab Results  Component Value Date   CREATININE 0.81 12/01/2023   CREATININE 0.73 03/17/2023   CREATININE 0.72 11/08/2022    Lab Results  Component Value Date   WBC 7.0 12/01/2023   HGB 14.2 12/01/2023   HCT 43.0 12/01/2023   PLT 316.0 12/01/2023   GLUCOSE 86 12/01/2023   CHOL 186 12/01/2023   TRIG 79.0 12/01/2023   HDL 74.70 12/01/2023   LDLDIRECT 100.0 12/01/2023   LDLCALC 95 12/01/2023   ALT 16 12/01/2023   AST 16 12/01/2023   NA 138 12/01/2023   K 4.5 12/01/2023   CL 101 12/01/2023   CREATININE 0.81 12/01/2023   BUN 16 12/01/2023   CO2 28 12/01/2023    TSH 1.09 12/01/2023   HGBA1C 5.5 12/01/2023    MR BRAIN WO CONTRAST Result Date: 04/29/2023 CLINICAL DATA:  Worsening headaches. EXAM: MRI HEAD WITHOUT CONTRAST TECHNIQUE: Multiplanar, multiecho pulse sequences of the brain and surrounding structures were obtained without intravenous contrast. COMPARISON:  None Available. FINDINGS: Brain: No acute infarction, hemorrhage, hydrocephalus, extra-axial collection or mass lesion. Vascular: Major arterial flow voids are maintained. Skull and upper cervical spine: Normal marrow signal. Sinuses/Orbits: Negative. IMPRESSION: Normal brain MRI for patient age.  No acute abnormality. Electronically Signed   By: Gilmore GORMAN Molt M.D.   On: 04/29/2023 18:57    Assessment & Plan:  .Hyperlipidemia, unspecified hyperlipidemia type  Impaired fasting glucose     I spent 34 minutes on the day of this face to face encounter reviewing patient's  most recent visit with cardiology,  nephrology,  and neurology,  prior relevant surgical and non surgical procedures, recent  labs and imaging studies, counseling on weight management,  reviewing the assessment and plan with patient, and post visit ordering and reviewing of  diagnostics and therapeutics with patient  .   Follow-up: No follow-ups on file.   Verneita LITTIE Kettering, MD "

## 2024-10-30 NOTE — Telephone Encounter (Signed)
 Rx was sent over for patient at today's visit with provider.

## 2024-10-30 NOTE — Telephone Encounter (Signed)
 Noted

## 2024-10-31 ENCOUNTER — Encounter: Payer: Self-pay | Admitting: Internal Medicine

## 2024-10-31 NOTE — Assessment & Plan Note (Signed)
 She continues to function well on a stable dose of Vyvanse  20 mg daily  and has an rx for low dose adderall to use in the afternoon prn.  She is not using the adderall daily Refill history confirmed via [place] Controlled Substance databas, accessed by me today.SABRA

## 2024-10-31 NOTE — Assessment & Plan Note (Signed)
 Thus far her symptoms have not been aggravated by prn us  of 5 mg dose of  immediate release amphetamines . Continue use of occlusive device  and periodic botox injections.

## 2024-10-31 NOTE — Progress Notes (Signed)
 Patient ID: Candice Vaughn, female    DOB: August 17, 1971  Age: 54 y.o. MRN: [id number]  The patient is here for annual preventive examination and management of other chronic and acute problems.   The risk factors are reflected in the social history.   The roster of all physicians providing medical care to patient - is listed in the Snapshot section of the chart.   Activities of daily living:  The patient is 100% independent in all ADLs: dressing, toileting, feeding as well as independent mobility   Home safety : The patient has smoke detectors in the home. They wear seatbelts.  There are no unsecured firearms at home. There is no violence in the home.    There is no risks for hepatitis, STDs or HIV. There is no   history of blood transfusion. They have no travel history to infectious disease endemic areas of the world.   The patient has seen their dentist in the last six month. They have seen their eye doctor in the last year. The patinet  denies slight hearing difficulty with regard to whispered voices and some television programs.  They have deferred audiologic testing in the last year.  They do not  have excessive sun exposure. Discussed the need for sun protection: hats, long sleeves and use of sunscreen if there is significant sun exposure.    Diet: the importance of a healthy diet is discussed. They do have a healthy diet.   The benefits of regular aerobic exercise were discussed. The patient  is not exercising regularly .    Depression screen: there are no signs or vegative symptoms of depression- irritability, change in appetite, anhedonia, sadness/tearfullness.   The following portions of the patient's history were reviewed and updated as appropriate: allergies, current medications, past family history, past medical history,  past surgical history, past social history  and problem list.   Visual acuity was not assessed per patient preference since the patient has regular follow up with  an  ophthalmologist. Hearing and body mass index were assessed and reviewed.    During the course of the visit the patient was educated and counseled about appropriate screening and preventive services including : fall prevention , diabetes screening, nutrition counseling, colorectal cancer screening, and recommended immunizations.    Chief Complaint:   follow up on multiple issues.  Last seen > 1 year ago:  1) ADD:  lost to follow up , was seen by Dr Abbey in September.  Vyvanse  refilled,  and  short acting adderall  5 mg dose add for prn use in the early afternoon.  Not using it daily,  last refill written in October by Dr Abbey. Sleeping well.  Has not had increase in bruxism/ TMJ symptoms with  5 mg dose   2) obesity: s/p gastric sleeve in 2016,  personal best occurred in 2022 with nadir of 180 lbs using GLP 1 therapy , currently using reconstituted semaglutide  obtained online along with Wellbutrin /naltrexone   prescribed by me to manage food cravings.  Weight stable but up 27 lbs from nadir   3) menopause:  contemplating HRT including testosterone supplementation.  Reviewed current AMA guidelines which do not include testosterone supplementation in women.   4) Migraine disorder:  currently  receiving Botox injections every 3-4 months from neurology, referred by Dr Lane to Dr Erick for management of recurrent left frontal retro-orbital throbbing associated photophobia phonophobia and nausea. Frequency has been reduced from 15-17 headache days per month to 2 /month after recent  use of Botox (Nov 2025).   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  She is not using the adderall daily Refill history confirmed via [place] Controlled Substance databas, accessed by me today..   Orders: -     Lisdexamfetamine  Dimesylate; Take 1 capsule (20 mg total) by mouth daily.  Dispense: 30 capsule; Refill: 0  Axillary hyperhidrosis Assessment & Plan: Managed by [facility] with Botox injections done quarterly   Encounter for preventive health examination Assessment & Plan: age appropriate education and counseling updated, referrals for preventative services and immunizations addressed, dietary and smoking counseling addressed, most recent labs reviewed.  I have personally reviewed and have noted:   1) the patient's medical and social history 2) The pt's use of alcohol, tobacco, and illicit drugs 3) The patient's current medications and supplements 4) Functional ability including ADL's, fall risk, home safety risk, hearing and visual impairment 5) Diet and physical activities 6) Evidence for depression or mood disorder 7) The patient's height, weight, and BMI have been recorded in  the chart   I have made referrals, and provided counseling and education based on review of the above    TMJ (temporomandibular joint syndrome) Assessment & Plan: Thus far her symptoms have not been aggravated by prn us  of 5 mg dose of  immediate release amphetamines . Continue use of occlusive device  and periodic botox injections.    Migraine syndrome Assessment & Plan: Headache occurrence has remained at a frequency of 2/month with use of  botox injections by [facility] every 3 months ,   by Eva Sarna, MD at [facility]   Menopause syndrome Assessment & Plan: Testosterone level requested and ordered.  Advised against seeking testosterone replacement regardless of testosterone levell    Obesity (BMI 30-39.9) Assessment & Plan: She reached a nadir of 180 lbs on  OZEMPIC   in 2022 but had weight regain of 27 lbs since suspension of medication. Weight has been stable using  reconstituted semiglutide obtained online, along with wellbutrin  and naltrexone .  Encouraged to increase aerobic activity    Other orders -     buPROPion  HCl ER (XL); Take 1 tablet (300 mg total) by mouth daily.  Dispense: 90 tablet;  Refill: 1 -     Naltrexone  HCl; Take 1 tablet (50 mg total) by mouth daily.  Dispense: 90 tablet; Refill: 1 -     EPINEPHrine ; Inject 0.3 mg into the muscle as needed for anaphylaxis.  Dispense: 1 each; Refill: 0 -     ALPRAZolam ; TAKE 1 TABLET BY MOUTH EVERY DAY AT BEDTIME AS NEEDED  Dispense: 30 tablet; Refill: 3    No follow-ups on file.  Verneita LITTIE Kettering, MD

## 2024-10-31 NOTE — Assessment & Plan Note (Signed)
 Testosterone level requested and ordered.  Advised against seeking testosterone replacement regardless of testosterone levell

## 2024-10-31 NOTE — Assessment & Plan Note (Signed)
 Headache occurrence has remained at a frequency of 2/month with use of  botox injections by [facility] every 3 months ,   by Eva Sarna, MD at [facility]

## 2024-10-31 NOTE — Assessment & Plan Note (Signed)
 She reached a nadir of 180 lbs on  OZEMPIC   in 2022 but had weight regain of 27 lbs since suspension of medication. Weight has been stable using  reconstituted semiglutide obtained online, along with wellbutrin  and naltrexone .  Encouraged to increase aerobic activity

## 2024-10-31 NOTE — Assessment & Plan Note (Signed)
 Managed by [facility] with Botox injections done quarterly

## 2024-10-31 NOTE — Assessment & Plan Note (Signed)

## 2025-03-07 ENCOUNTER — Encounter: Admitting: Internal Medicine
